# Patient Record
Sex: Male | Born: 1950 | Race: White | Hispanic: No | Marital: Married | Smoking: Never smoker
Health system: Southern US, Community
[De-identification: ages and names within clinical notes are randomized; demographics above are authoritative.]

## PROBLEM LIST (undated history)

## (undated) DIAGNOSIS — M549 Dorsalgia, unspecified: Secondary | ICD-10-CM

## (undated) DIAGNOSIS — G8929 Other chronic pain: Secondary | ICD-10-CM

## (undated) DIAGNOSIS — K219 Gastro-esophageal reflux disease without esophagitis: Secondary | ICD-10-CM

## (undated) DIAGNOSIS — E785 Hyperlipidemia, unspecified: Secondary | ICD-10-CM

## (undated) DIAGNOSIS — G629 Polyneuropathy, unspecified: Secondary | ICD-10-CM

## (undated) HISTORY — DX: Polyneuropathy, unspecified: G62.9

## (undated) HISTORY — PX: OTHER SURGICAL HISTORY: SHX169

## (undated) HISTORY — PX: CARPAL TUNNEL RELEASE: SHX101

## (undated) HISTORY — DX: Hyperlipidemia, unspecified: E78.5

---

## 1998-01-31 ENCOUNTER — Ambulatory Visit (HOSPITAL_COMMUNITY): Admission: RE | Admit: 1998-01-31 | Discharge: 1998-01-31 | Payer: Self-pay | Admitting: Internal Medicine

## 2001-02-03 ENCOUNTER — Ambulatory Visit (HOSPITAL_COMMUNITY): Admission: RE | Admit: 2001-02-03 | Discharge: 2001-02-03 | Payer: Self-pay | Admitting: Gastroenterology

## 2003-02-16 ENCOUNTER — Encounter: Admission: RE | Admit: 2003-02-16 | Discharge: 2003-02-16 | Payer: Self-pay | Admitting: Internal Medicine

## 2003-02-16 ENCOUNTER — Encounter: Payer: Self-pay | Admitting: Internal Medicine

## 2011-07-10 ENCOUNTER — Other Ambulatory Visit: Payer: Self-pay | Admitting: Internal Medicine

## 2011-07-10 DIAGNOSIS — M545 Low back pain, unspecified: Secondary | ICD-10-CM

## 2011-07-11 ENCOUNTER — Ambulatory Visit
Admission: RE | Admit: 2011-07-11 | Discharge: 2011-07-11 | Disposition: A | Discharge status: Home / Self Care | Payer: 59 | Source: Ambulatory Visit | Attending: Internal Medicine | Admitting: Internal Medicine

## 2011-07-11 ENCOUNTER — Other Ambulatory Visit: Payer: Self-pay | Admitting: Internal Medicine

## 2011-07-11 DIAGNOSIS — IMO0001 Reserved for inherently not codable concepts without codable children: Secondary | ICD-10-CM

## 2011-07-11 DIAGNOSIS — M545 Low back pain, unspecified: Secondary | ICD-10-CM

## 2011-07-11 DIAGNOSIS — Z77018 Contact with and (suspected) exposure to other hazardous metals: Secondary | ICD-10-CM

## 2011-07-12 ENCOUNTER — Other Ambulatory Visit: Payer: Self-pay

## 2011-08-25 ENCOUNTER — Encounter (HOSPITAL_COMMUNITY): Payer: Self-pay | Admitting: Emergency Medicine

## 2011-08-25 ENCOUNTER — Emergency Department (HOSPITAL_COMMUNITY)
Admission: EM | Admit: 2011-08-25 | Discharge: 2011-08-25 | Disposition: A | Discharge status: Home / Self Care | Payer: 59 | Attending: Emergency Medicine | Admitting: Emergency Medicine

## 2011-08-25 DIAGNOSIS — M549 Dorsalgia, unspecified: Secondary | ICD-10-CM

## 2011-08-25 DIAGNOSIS — M5432 Sciatica, left side: Secondary | ICD-10-CM

## 2011-08-25 DIAGNOSIS — G8929 Other chronic pain: Secondary | ICD-10-CM | POA: Insufficient documentation

## 2011-08-25 DIAGNOSIS — M5430 Sciatica, unspecified side: Secondary | ICD-10-CM | POA: Insufficient documentation

## 2011-08-25 DIAGNOSIS — J45909 Unspecified asthma, uncomplicated: Secondary | ICD-10-CM | POA: Insufficient documentation

## 2011-08-25 HISTORY — DX: Other chronic pain: G89.29

## 2011-08-25 HISTORY — DX: Dorsalgia, unspecified: M54.9

## 2011-08-25 MED ORDER — HYDROMORPHONE HCL PF 2 MG/ML IJ SOLN
2.0000 mg | Freq: Once | INTRAMUSCULAR | Status: AC
  Administered 2011-08-25: 2 mg via INTRAMUSCULAR
  Filled 2011-08-25: qty 1

## 2011-08-25 MED ORDER — ONDANSETRON 4 MG PO TBDP
8.0000 mg | ORAL_TABLET | Freq: Once | ORAL | Status: AC
  Administered 2011-08-25: 8 mg via ORAL
  Filled 2011-08-25: qty 2

## 2011-08-25 MED ORDER — HYDROMORPHONE HCL 2 MG PO TABS: 2.0000 mg | ORAL_TABLET | ORAL | Status: DC | PRN

## 2011-08-25 MED ORDER — ONDANSETRON HCL 4 MG PO TABS: 4.0000 mg | ORAL_TABLET | Freq: Four times a day (QID) | ORAL | Status: DC

## 2011-08-25 NOTE — Discharge Instructions (Signed)
Charles Harrington keep your follow up appointment with Dr. Jeral Fruit tomorrow and your epidural injection if he says its ok.  Turn to the ER for any bowel or bladder dysfunction, fever,  perineal numbness or weakness in her lower extremity. They did a lot of as needed for pain every 4 hours. Zofran as for nausea.    Back Pain, Adult Back pain is very common. The pain often gets better over time. The cause of back pain is usually not dangerous. Most people can learn to manage their back pain on their own.  HOME CARE   Stay active. Start with short walks on flat ground if you can. Try to walk farther each day.   Do not sit, drive, or stand in one place for more than 30 minutes. Do not stay in bed.   Do not avoid exercise or work. Activity can help your back heal faster.   Be careful when you bend or lift an object. Bend at your knees, keep the object close to you, and do not twist.   Sleep on a firm mattress. Lie on your side, and bend your knees. If you lie on your back, put a pillow under your knees.   Only take medicines as told by your doctor.   Put ice on the injured area.   Put ice in a plastic bag.   Place a towel between your skin and the bag.   Leave the ice on for 15 to 20 minutes, 3 to 4 times a day for the first 2 to 3 days. After that, you can switch between ice and heat packs.   Ask your doctor about back exercises or massage.   Avoid feeling anxious or stressed. Find good ways to deal with stress, such as exercise.  GET HELP RIGHT AWAY IF:   Your pain does not go away with rest or medicine.   Your pain does not go away in 1 week.   You have new problems.   You do not feel well.   The pain spreads into your legs.   You cannot control when you poop (bowel movement) or pee (urinate).   Your arms or legs feel weak or lose feeling (numbness).   You feel sick to your stomach (nauseous) or throw up (vomit).   You have belly (abdominal) pain.   You feel like you may pass  out (faint).  MAKE SURE YOU:   Understand these instructions.   Will watch your condition.   Will get help right away if you are not doing well or get worse.  Document Released: 11/05/2007 Document Revised: 05/08/2011 Document Reviewed: 10/07/2010 [facility] Patient Information 2012 [place], [place].Back Pain, Adult Back pain is very common. The pain often gets better over time. The cause of back pain is usually not dangerous. Most people can learn to manage their back pain on their own.  HOME CARE   Stay active. Start with short walks on flat ground if you can. Try to walk farther each day.   Do not sit, drive, or stand in one place for more than 30 minutes. Do not stay in bed.   Do not avoid exercise or work. Activity can help your back heal faster.   Be careful when you bend or lift an object. Bend at your knees, keep the object close to you, and do not twist.   Sleep on a firm mattress. Lie on your side, and bend your knees. If you lie on your back, put a  pillow under your knees.   Only take medicines as told by your doctor.   Put ice on the injured area.   Put ice in a plastic bag.   Place a towel between your skin and the bag.   Leave the ice on for 15 to 20 minutes, 3 to 4 times a day for the first 2 to 3 days. After that, you can switch between ice and heat packs.   Ask your doctor about back exercises or massage.   Avoid feeling anxious or stressed. Find good ways to deal with stress, such as exercise.  GET HELP RIGHT AWAY IF:   Your pain does not go away with rest or medicine.   Your pain does not go away in 1 week.   You have new problems.   You do not feel well.   The pain spreads into your legs.   You cannot control when you poop (bowel movement) or pee (urinate).   Your arms or legs feel weak or lose feeling (numbness).   You feel sick to your stomach (nauseous) or throw up (vomit).   You have belly (abdominal) pain.   You feel like you may pass out  (faint).  MAKE SURE YOU:   Understand these instructions.   Will watch your condition.   Will get help right away if you are not doing well or get worse.  Document Released: 11/05/2007 Document Revised: 05/08/2011 Document Reviewed: 10/07/2010 [facility] Patient Information 2012 [place], [place].

## 2011-08-25 NOTE — ED Notes (Signed)
Was waiting for MD to put D/C in [facility]

## 2011-08-25 NOTE — ED Provider Notes (Signed)
History     CSN: [id number]  Arrival date & time 08/25/11  1516   First MD Initiated Contact with Patient 08/25/11 1753      Chief Complaint  Patient presents with  . Back Pain    (Consider location/radiation/quality/duration/timing/severity/associated sxs/prior treatment) Patient is a 61 y.o. male presenting with back pain. The history is provided by the patient. No language interpreter was used.  Back Pain  This is a recurrent problem. The current episode started 12 to 24 hours ago. The problem occurs constantly. The problem has not changed since onset.The quality of the pain is described as shooting, aching and burning. The pain radiates to the left knee. The pain is at a severity of 10/10. The pain is severe. The symptoms are aggravated by bending and certain positions. Pertinent negatives include no fever, no numbness, no bowel incontinence, no perianal numbness, no bladder incontinence, no dysuria, no pelvic pain, no leg pain, no paresthesias, no paresis, no tingling and no weakness. Treatments tried: hydrocodone. The treatment provided no relief.   Patient reports left lower back pain that radiates to his buttocks down into his left posterior calf. States that this pain is chronic and he is getting epidural injections for the pain. First injection was 2 weeks ago. He also sees Dr.Botero for this problem. Next appintment with Jeral Fruit is tomorrow am at 11am.  Next epidural injection as this Thursday. Good reflexes to left knee.  Past Medical History  Diagnosis Date  . Asthma   . Chronic back pain     History reviewed. No pertinent past surgical history.  History reviewed. No pertinent family history.  History  Substance Use Topics  . Smoking status: Never Smoker   . Smokeless tobacco: Not on file  . Alcohol Use: No      Review of Systems  Unable to perform ROS Constitutional: Negative.  Negative for fever.  HENT: Negative.   Eyes: Negative.   Respiratory: Negative.     Cardiovascular: Negative.   Gastrointestinal: Negative.  Negative for bowel incontinence.  Genitourinary: Negative for bladder incontinence, dysuria, hematuria, flank pain, testicular pain and pelvic pain.  Musculoskeletal: Positive for back pain and gait problem.  Neurological: Negative.  Negative for dizziness, tingling, weakness, numbness and paresthesias.  Psychiatric/Behavioral: Negative.   All other systems reviewed and are negative.    Allergies  Codeine  Home Medications   Current Outpatient Rx  Name Route Sig Dispense Refill  . ALBUTEROL SULFATE HFA 108 (90 BASE) MCG/ACT IN AERS Inhalation Inhale 2 puffs into the lungs every 4 (four) hours as needed. For shortness of breath    . BUDESONIDE 180 MCG/ACT IN AEPB Inhalation Inhale 2 puffs into the lungs daily.    Marland Kitchen VITAMIN D3 1000 UNITS PO CAPS Oral Take 1 capsule by mouth daily.    Marland Kitchen VITAMIN B-12 PO Oral Take 1 tablet by mouth daily.    . CYCLOBENZAPRINE HCL 10 MG PO TABS Oral Take 10 mg by mouth 3 (three) times daily as needed. For back pain    . DOCUSATE SODIUM 100 MG PO CAPS Oral Take 200 mg by mouth 2 (two) times daily.    Marland Kitchen HYDROCODONE-ACETAMINOPHEN 5-325 MG PO TABS Oral Take 2 tablets by mouth every 6 (six) hours as needed. For pain      BP 142/81  Pulse 93  Temp(Src) 98.7 F (37.1 C) (Oral)  Resp 18  SpO2 94%  Physical Exam  Nursing note and vitals reviewed. Constitutional: He is oriented to person,  place, and time. He appears well-developed and well-nourished.  HENT:  Head: Normocephalic.  Eyes: Conjunctivae and EOM are normal. Pupils are equal, round, and reactive to light.  Neck: Normal range of motion. Neck supple.  Cardiovascular: Normal rate.   Pulmonary/Chest: Effort normal.  Abdominal: Soft.  Musculoskeletal: He exhibits tenderness.       Tenderness to L buttocks down posterior Leg to L calf.  Good reflexes.    Neurological: He is alert and oriented to person, place, and time.  Skin: Skin is warm  and dry.  Psychiatric: He has a normal mood and affect.    ED Course  Procedures (including critical care time)  Labs Reviewed - No data to display No results found.   No diagnosis found.    MDM  Dilaudid 2mg  IM in the ER today for sciatica pain down the L buttocks and LLE.  No numbness, bowel or bladder dysfunction, fever or weakness.  11am appoinment with Dr. Jeral Fruit tomorrow.  Epidural injection on Thursday.  Rx for dilaudid.        Jethro Bastos, NP 08/26/11 1001

## 2011-08-25 NOTE — ED Notes (Signed)
Patient and family verbalized complete understanding of d/c home

## 2011-08-25 NOTE — ED Notes (Signed)
Pt c/o lower back pain with radiation down left leg x several days which became more severe today; pt denies new injury; pt sts hx of bulging

## 2011-08-26 ENCOUNTER — Other Ambulatory Visit: Payer: Self-pay | Admitting: Neurosurgery

## 2011-08-26 ENCOUNTER — Encounter (HOSPITAL_COMMUNITY): Payer: Self-pay | Admitting: Respiratory Therapy

## 2011-08-26 ENCOUNTER — Other Ambulatory Visit (HOSPITAL_COMMUNITY): Payer: Self-pay | Admitting: *Deleted

## 2011-08-26 ENCOUNTER — Encounter (HOSPITAL_COMMUNITY): Payer: Self-pay | Admitting: *Deleted

## 2011-08-26 MED ORDER — CEFAZOLIN SODIUM 1-5 GM-% IV SOLN
1.0000 g | INTRAVENOUS | Status: DC
  Filled 2011-08-26: qty 50

## 2011-08-27 ENCOUNTER — Ambulatory Visit (HOSPITAL_COMMUNITY): Payer: 59

## 2011-08-27 ENCOUNTER — Ambulatory Visit (HOSPITAL_COMMUNITY): Payer: 59 | Admitting: Certified Registered"

## 2011-08-27 ENCOUNTER — Ambulatory Visit (HOSPITAL_COMMUNITY)
Admission: RE | Admit: 2011-08-27 | Discharge: 2011-08-28 | Disposition: A | Discharge status: Home / Self Care | Payer: 59 | Source: Ambulatory Visit | Attending: Neurosurgery | Admitting: Neurosurgery

## 2011-08-27 ENCOUNTER — Encounter (HOSPITAL_COMMUNITY)
Admission: RE | Disposition: A | Discharge status: Home / Self Care | Payer: Self-pay | Source: Ambulatory Visit | Attending: Neurosurgery

## 2011-08-27 ENCOUNTER — Encounter (HOSPITAL_COMMUNITY): Payer: Self-pay | Admitting: Certified Registered"

## 2011-08-27 DIAGNOSIS — J701 Chronic and other pulmonary manifestations due to radiation: Secondary | ICD-10-CM | POA: Insufficient documentation

## 2011-08-27 DIAGNOSIS — J45909 Unspecified asthma, uncomplicated: Secondary | ICD-10-CM | POA: Insufficient documentation

## 2011-08-27 DIAGNOSIS — G8929 Other chronic pain: Secondary | ICD-10-CM | POA: Insufficient documentation

## 2011-08-27 DIAGNOSIS — M5126 Other intervertebral disc displacement, lumbar region: Secondary | ICD-10-CM | POA: Insufficient documentation

## 2011-08-27 HISTORY — PX: LUMBAR LAMINECTOMY/DECOMPRESSION MICRODISCECTOMY: SHX5026

## 2011-08-27 HISTORY — DX: Gastro-esophageal reflux disease without esophagitis: K21.9

## 2011-08-27 LAB — CBC
MCH: 30.4 pg (ref 26.0–34.0)
Platelets: 213 10*3/uL (ref 150–400)
RBC: 4.93 MIL/uL (ref 4.22–5.81)
WBC: 10.1 10*3/uL (ref 4.0–10.5)

## 2011-08-27 LAB — SURGICAL PCR SCREEN: MRSA, PCR: NEGATIVE

## 2011-08-27 LAB — BASIC METABOLIC PANEL
BUN: 13 mg/dL (ref 6–23)
Calcium: 10.1 mg/dL (ref 8.4–10.5)
Creatinine, Ser: 0.91 mg/dL (ref 0.50–1.35)
GFR calc Af Amer: >90 mL/min (ref >90)
GFR calc non Af Amer: >90 mL/min (ref >90)

## 2011-08-27 LAB — GLUCOSE, CAPILLARY: Glucose-Capillary: 81 mg/dL (ref 70–99)

## 2011-08-27 SURGERY — LUMBAR LAMINECTOMY/DECOMPRESSION MICRODISCECTOMY 2 LEVELS
Anesthesia: General | Site: Back | Laterality: Left | Wound class: Clean

## 2011-08-27 MED ORDER — ALBUTEROL SULFATE HFA 108 (90 BASE) MCG/ACT IN AERS
2.0000 | INHALATION_SPRAY | Freq: Four times a day (QID) | RESPIRATORY_TRACT | Status: DC | PRN
Start: 2011-08-27 — End: 2011-08-28
  Filled 2011-08-27: qty 6.7

## 2011-08-27 MED ORDER — DEXAMETHASONE SODIUM PHOSPHATE 4 MG/ML IJ SOLN
INTRAMUSCULAR | Status: DC | PRN
  Administered 2011-08-27: 4 mg via INTRAVENOUS

## 2011-08-27 MED ORDER — MORPHINE SULFATE 4 MG/ML IJ SOLN: 0.0500 mg/kg | INTRAMUSCULAR | Status: DC | PRN

## 2011-08-27 MED ORDER — DIAZEPAM 5 MG PO TABS
5.0000 mg | ORAL_TABLET | Freq: Four times a day (QID) | ORAL | Status: DC | PRN
  Administered 2011-08-27 – 2011-08-28 (×3): 5 mg via ORAL
  Filled 2011-08-27 (×3): qty 1

## 2011-08-27 MED ORDER — FENTANYL CITRATE 0.05 MG/ML IJ SOLN
INTRAMUSCULAR | Status: AC
  Filled 2011-08-27: qty 2

## 2011-08-27 MED ORDER — ACETAMINOPHEN 650 MG RE SUPP: 650.0000 mg | RECTAL | Status: DC | PRN

## 2011-08-27 MED ORDER — FLUTICASONE PROPIONATE HFA 44 MCG/ACT IN AERO
2.0000 | INHALATION_SPRAY | Freq: Two times a day (BID) | RESPIRATORY_TRACT | Status: DC
  Administered 2011-08-28: 2 via RESPIRATORY_TRACT
  Filled 2011-08-27: qty 10.6

## 2011-08-27 MED ORDER — MUPIROCIN 2 % EX OINT
TOPICAL_OINTMENT | CUTANEOUS | Status: AC
  Filled 2011-08-27: qty 22

## 2011-08-27 MED ORDER — CEFAZOLIN SODIUM 1-5 GM-% IV SOLN
1.0000 g | Freq: Three times a day (TID) | INTRAVENOUS | Status: AC
  Administered 2011-08-27 – 2011-08-28 (×2): 1 g via INTRAVENOUS
  Filled 2011-08-27 (×2): qty 50

## 2011-08-27 MED ORDER — MIDAZOLAM HCL 5 MG/5ML IJ SOLN
INTRAMUSCULAR | Status: DC | PRN
  Administered 2011-08-27: 2 mg via INTRAVENOUS

## 2011-08-27 MED ORDER — THROMBIN 5000 UNITS EX SOLR
CUTANEOUS | Status: DC | PRN
  Administered 2011-08-27 (×2): 5000 [IU] via TOPICAL

## 2011-08-27 MED ORDER — HYDROMORPHONE HCL PF 1 MG/ML IJ SOLN
INTRAMUSCULAR | Status: AC
  Administered 2011-08-27: 0.5 mg via INTRAVENOUS
  Filled 2011-08-27: qty 1

## 2011-08-27 MED ORDER — GLYCOPYRROLATE 0.2 MG/ML IJ SOLN
INTRAMUSCULAR | Status: DC | PRN
  Administered 2011-08-27: .6 mg via INTRAVENOUS

## 2011-08-27 MED ORDER — SODIUM CHLORIDE 0.9 % IJ SOLN: 3.0000 mL | INTRAMUSCULAR | Status: DC | PRN

## 2011-08-27 MED ORDER — CEFAZOLIN SODIUM 1-5 GM-% IV SOLN
INTRAVENOUS | Status: DC | PRN
  Administered 2011-08-27: 1 g via INTRAVENOUS

## 2011-08-27 MED ORDER — ACETAMINOPHEN 325 MG PO TABS: 650.0000 mg | ORAL_TABLET | ORAL | Status: DC | PRN

## 2011-08-27 MED ORDER — ONDANSETRON HCL 4 MG/2ML IJ SOLN
INTRAMUSCULAR | Status: DC | PRN
  Administered 2011-08-27: 4 mg via INTRAVENOUS

## 2011-08-27 MED ORDER — LIDOCAINE HCL 4 % MT SOLN
OROMUCOSAL | Status: DC | PRN
  Administered 2011-08-27: 4 mL via TOPICAL

## 2011-08-27 MED ORDER — OXYCODONE-ACETAMINOPHEN 5-325 MG PO TABS
1.0000 | ORAL_TABLET | ORAL | Status: DC | PRN
  Administered 2011-08-28: 2 via ORAL
  Filled 2011-08-27: qty 2

## 2011-08-27 MED ORDER — DOCUSATE SODIUM 100 MG PO CAPS
200.0000 mg | ORAL_CAPSULE | Freq: Two times a day (BID) | ORAL | Status: DC
  Administered 2011-08-27 – 2011-08-28 (×2): 200 mg via ORAL
  Filled 2011-08-27: qty 2
  Filled 2011-08-27: qty 1

## 2011-08-27 MED ORDER — FENTANYL CITRATE 0.05 MG/ML IJ SOLN
INTRAMUSCULAR | Status: DC | PRN
  Administered 2011-08-27 (×3): 50 ug via INTRAVENOUS
  Administered 2011-08-27: 100 ug via INTRAVENOUS

## 2011-08-27 MED ORDER — SODIUM CHLORIDE 0.9 % IV SOLN: 250.0000 mL | INTRAVENOUS | Status: DC

## 2011-08-27 MED ORDER — PHENOL 1.4 % MT LIQD: 1.0000 | OROMUCOSAL | Status: DC | PRN

## 2011-08-27 MED ORDER — LIDOCAINE HCL (CARDIAC) 20 MG/ML IV SOLN
INTRAVENOUS | Status: DC | PRN
  Administered 2011-08-27: 80 mg via INTRAVENOUS

## 2011-08-27 MED ORDER — LACTATED RINGERS IV SOLN: INTRAVENOUS | Status: DC

## 2011-08-27 MED ORDER — LACTATED RINGERS IV SOLN
INTRAVENOUS | Status: DC | PRN
  Administered 2011-08-27 (×2): via INTRAVENOUS

## 2011-08-27 MED ORDER — ONDANSETRON HCL 4 MG PO TABS
4.0000 mg | ORAL_TABLET | Freq: Four times a day (QID) | ORAL | Status: DC
  Administered 2011-08-27 – 2011-08-28 (×3): 4 mg via ORAL
  Filled 2011-08-27 (×6): qty 1

## 2011-08-27 MED ORDER — BUPIVACAINE-EPINEPHRINE PF 0.5-1:200000 % IJ SOLN
INTRAMUSCULAR | Status: DC | PRN
  Administered 2011-08-27: 20 mL

## 2011-08-27 MED ORDER — HYDROMORPHONE HCL PF 1 MG/ML IJ SOLN
INTRAMUSCULAR | Status: AC
  Filled 2011-08-27: qty 1

## 2011-08-27 MED ORDER — ROCURONIUM BROMIDE 100 MG/10ML IV SOLN
INTRAVENOUS | Status: DC | PRN
  Administered 2011-08-27: 50 mg via INTRAVENOUS

## 2011-08-27 MED ORDER — NEOSTIGMINE METHYLSULFATE 1 MG/ML IJ SOLN
INTRAMUSCULAR | Status: DC | PRN
  Administered 2011-08-27: 5 mg via INTRAVENOUS

## 2011-08-27 MED ORDER — PROPOFOL 10 MG/ML IV EMUL
INTRAVENOUS | Status: DC | PRN
  Administered 2011-08-27: 200 mg via INTRAVENOUS

## 2011-08-27 MED ORDER — HYDROMORPHONE HCL PF 1 MG/ML IJ SOLN
0.2500 mg | INTRAMUSCULAR | Status: DC | PRN
  Administered 2011-08-27 (×4): 0.5 mg via INTRAVENOUS

## 2011-08-27 MED ORDER — MORPHINE SULFATE 4 MG/ML IJ SOLN
4.0000 mg | INTRAMUSCULAR | Status: DC | PRN
  Administered 2011-08-27 – 2011-08-28 (×2): 4 mg via INTRAVENOUS
  Filled 2011-08-27 (×2): qty 1

## 2011-08-27 MED ORDER — ONDANSETRON HCL 4 MG/2ML IJ SOLN: 4.0000 mg | INTRAMUSCULAR | Status: DC | PRN

## 2011-08-27 MED ORDER — METHYLPREDNISOLONE ACETATE 80 MG/ML IJ SUSP
INTRAMUSCULAR | Status: DC | PRN
  Administered 2011-08-27: 80 mg

## 2011-08-27 MED ORDER — SODIUM CHLORIDE 0.9 % IJ SOLN
3.0000 mL | Freq: Two times a day (BID) | INTRAMUSCULAR | Status: DC
  Administered 2011-08-27: 3 mL via INTRAVENOUS

## 2011-08-27 MED ORDER — FENTANYL CITRATE 0.05 MG/ML IJ SOLN
INTRAMUSCULAR | Status: DC | PRN
  Administered 2011-08-27: 100 ug via INTRAVENOUS

## 2011-08-27 MED ORDER — MENTHOL 3 MG MT LOZG: 1.0000 | LOZENGE | OROMUCOSAL | Status: DC | PRN

## 2011-08-27 MED ORDER — SODIUM CHLORIDE 0.9 % IV SOLN
INTRAVENOUS | Status: DC
  Administered 2011-08-27: 22:00:00 via INTRAVENOUS

## 2011-08-27 SURGICAL SUPPLY — 55 items
APL SKNCLS STERI-STRIP NONHPOA (GAUZE/BANDAGES/DRESSINGS) ×1
BENZOIN TINCTURE PRP APPL 2/3 (GAUZE/BANDAGES/DRESSINGS) ×2 IMPLANT
BLADE SURG ROTATE 9660 (MISCELLANEOUS) IMPLANT
BUR ACORN 6.0 (BURR) IMPLANT
BUR MATCHSTICK NEURO 3.0 LAGG (BURR) ×2 IMPLANT
CANISTER SUCTION 2500CC (MISCELLANEOUS) ×2 IMPLANT
CLOTH BEACON ORANGE TIMEOUT ST (SAFETY) ×2 IMPLANT
CONT SPEC 4OZ CLIKSEAL STRL BL (MISCELLANEOUS) ×2 IMPLANT
DRAPE LAPAROTOMY 100X72X124 (DRAPES) ×2 IMPLANT
DRAPE MICROSCOPE LEICA (MISCELLANEOUS) ×2 IMPLANT
DRAPE POUCH INSTRU U-SHP 10X18 (DRAPES) ×2 IMPLANT
DRSG PAD ABDOMINAL 8X10 ST (GAUZE/BANDAGES/DRESSINGS) IMPLANT
DURAPREP 26ML APPLICATOR (WOUND CARE) ×2 IMPLANT
ELECT REM PT RETURN 9FT ADLT (ELECTROSURGICAL) ×2
ELECTRODE REM PT RTRN 9FT ADLT (ELECTROSURGICAL) ×1 IMPLANT
GAUZE SPONGE 4X4 16PLY XRAY LF (GAUZE/BANDAGES/DRESSINGS) IMPLANT
GLOVE BIOGEL M 8.0 STRL (GLOVE) ×2 IMPLANT
GLOVE ECLIPSE 7.5 STRL STRAW (GLOVE) ×2 IMPLANT
GLOVE EXAM NITRILE LRG STRL (GLOVE) IMPLANT
GLOVE EXAM NITRILE MD LF STRL (GLOVE) IMPLANT
GLOVE EXAM NITRILE XL STR (GLOVE) IMPLANT
GLOVE EXAM NITRILE XS STR PU (GLOVE) IMPLANT
GLOVE INDICATOR 7.5 STRL GRN (GLOVE) ×2 IMPLANT
GOWN BRE IMP SLV AUR LG STRL (GOWN DISPOSABLE) ×2 IMPLANT
GOWN BRE IMP SLV AUR XL STRL (GOWN DISPOSABLE) ×2 IMPLANT
GOWN STRL REIN 2XL LVL4 (GOWN DISPOSABLE) IMPLANT
KIT BASIN OR (CUSTOM PROCEDURE TRAY) ×2 IMPLANT
KIT ROOM TURNOVER OR (KITS) ×2 IMPLANT
NDL HYPO 18GX1.5 BLUNT FILL (NEEDLE) IMPLANT
NDL HYPO 21X1.5 SAFETY (NEEDLE) IMPLANT
NDL HYPO 25X1 1.5 SAFETY (NEEDLE) IMPLANT
NDL SPNL 20GX3.5 QUINCKE YW (NEEDLE) IMPLANT
NEEDLE HYPO 18GX1.5 BLUNT FILL (NEEDLE) ×2 IMPLANT
NEEDLE HYPO 21X1.5 SAFETY (NEEDLE) IMPLANT
NEEDLE HYPO 25X1 1.5 SAFETY (NEEDLE) IMPLANT
NEEDLE SPNL 20GX3.5 QUINCKE YW (NEEDLE) IMPLANT
NS IRRIG 1000ML POUR BTL (IV SOLUTION) ×2 IMPLANT
PACK LAMINECTOMY NEURO (CUSTOM PROCEDURE TRAY) ×2 IMPLANT
PAD ARMBOARD 7.5X6 YLW CONV (MISCELLANEOUS) ×6 IMPLANT
PATTIES SURGICAL .5 X1 (DISPOSABLE) ×2 IMPLANT
RUBBERBAND STERILE (MISCELLANEOUS) ×4 IMPLANT
SPONGE GAUZE 4X4 12PLY (GAUZE/BANDAGES/DRESSINGS) ×2 IMPLANT
SPONGE LAP 4X18 X RAY DECT (DISPOSABLE) IMPLANT
SPONGE SURGIFOAM ABS GEL SZ50 (HEMOSTASIS) ×2 IMPLANT
STRIP CLOSURE SKIN 1/2X4 (GAUZE/BANDAGES/DRESSINGS) ×2 IMPLANT
SUT VIC AB 0 CT1 18XCR BRD8 (SUTURE) ×1 IMPLANT
SUT VIC AB 0 CT1 8-18 (SUTURE) ×2
SUT VIC AB 2-0 CP2 18 (SUTURE) ×2 IMPLANT
SUT VIC AB 3-0 SH 8-18 (SUTURE) ×2 IMPLANT
SYR 20CC LL (SYRINGE) IMPLANT
SYR 20ML ECCENTRIC (SYRINGE) ×2 IMPLANT
SYR 5ML LL (SYRINGE) ×1 IMPLANT
TOWEL OR 17X24 6PK STRL BLUE (TOWEL DISPOSABLE) ×2 IMPLANT
TOWEL OR 17X26 10 PK STRL BLUE (TOWEL DISPOSABLE) ×2 IMPLANT
WATER STERILE IRR 1000ML POUR (IV SOLUTION) ×2 IMPLANT

## 2011-08-27 NOTE — Preoperative (Signed)
Beta Blockers   Reason not to administer Beta Blockers:Not Applicable 

## 2011-08-27 NOTE — Transfer of Care (Signed)
Immediate Anesthesia Transfer of Care Note  Patient: Charles Harrington  Procedure(s) Performed: Procedure(s) (LRB): LUMBAR LAMINECTOMY/DECOMPRESSION MICRODISCECTOMY 2 LEVELS (Left)  Patient Location: PACU  Anesthesia Type: General  Level of Consciousness: awake and alert   Airway & Oxygen Therapy: Patient Spontanous Breathing and Patient connected to face mask oxygen  Post-op Assessment: Report given to PACU RN and Post -op Vital signs reviewed and stable  Post vital signs: Reviewed and stable  Complications: No apparent anesthesia complications

## 2011-08-27 NOTE — Progress Notes (Signed)
Left l45discectomy and l5s1 foramonotomy done. Op note [phone]

## 2011-08-27 NOTE — Anesthesia Procedure Notes (Signed)
Procedure Name: Intubation Date/Time: 08/27/2011 4:13 PM Performed by: Glendora Score A Pre-anesthesia Checklist: Patient identified, Emergency Drugs available, Suction available and Patient being monitored Patient Re-evaluated:Patient Re-evaluated prior to inductionOxygen Delivery Method: Circle system utilized Preoxygenation: Pre-oxygenation with 100% oxygen Intubation Type: IV induction Ventilation: Mask ventilation without difficulty and Oral airway inserted - appropriate to patient size Laryngoscope Size: Hyacinth Meeker and 2 Grade View: Grade I Tube type: Oral Tube size: 7.5 mm Number of attempts: 1 Airway Equipment and Method: Stylet and LTA kit utilized Placement Confirmation: ETT inserted through vocal cords under direct vision,  positive ETCO2 and breath sounds checked- equal and bilateral Secured at: 23 cm Tube secured with: Tape Dental Injury: Teeth and Oropharynx as per pre-operative assessment

## 2011-08-27 NOTE — H&P (Signed)
Charles Harrington is an 61 y.o. male.   Chief Complaint:left leg pain. HPI: patient seen on 08/03/11 with c/o lbp with radiation to both legs. Seen in the emergency room with pain going to the left leg. Quite miserable . No c/o of gu or gi. mriof lumbar spine showed large hnp at l45 and l5s1 centrally.wants to go ahead with surgery since he is not getting better with conservative treatment.  Past Medical History  Diagnosis Date  . Asthma   . Chronic back pain   . Asthma   . GERD (gastroesophageal reflux disease)     Past Surgical History  Procedure Date  . Colonscopy     Family History  Problem Relation Age of Onset  . Anesthesia problems Neg Hx    Social History:  reports that he has never smoked. He does not have any smokeless tobacco history on file. He reports that he does not drink alcohol or use illicit drugs.  Allergies:  Allergies  Allergen Reactions  . Codeine Nausea And Vomiting    Medications Prior to Admission  Medication Dose Route Frequency Provider Last Rate Last Dose  . ceFAZolin (ANCEF) IVPB 1 g/50 mL premix  1 g Intravenous 60 min Pre-Op Karn Cassis, MD      . HYDROmorphone (DILAUDID) injection 2 mg  2 mg Intramuscular Once Jethro Bastos, NP   2 mg at 08/25/11 1847  . mupirocin ointment (BACTROBAN) 2 %           . ondansetron (ZOFRAN-ODT) disintegrating tablet 8 mg  8 mg Oral Once Jethro Bastos, NP   8 mg at 08/25/11 1849   Medications Prior to Admission  Medication Sig Dispense Refill  . albuterol (PROVENTIL HFA;VENTOLIN HFA) 108 (90 BASE) MCG/ACT inhaler Inhale 2 puffs into the lungs every 4 (four) hours as needed. For shortness of breath      . budesonide (PULMICORT) 180 MCG/ACT inhaler Inhale 2 puffs into the lungs daily.      . Cholecalciferol (VITAMIN D3) 1000 UNITS CAPS Take 1 capsule by mouth daily.      . Cyanocobalamin (VITAMIN B-12 PO) Take 1 tablet by mouth daily.      . cyclobenzaprine (FLEXERIL) 10 MG tablet Take 10 mg by mouth 3 (three)  times daily as needed. For back pain      . docusate sodium (COLACE) 100 MG capsule Take 200 mg by mouth 2 (two) times daily.      Marland Kitchen HYDROcodone-acetaminophen (NORCO) 5-325 MG per tablet Take 2 tablets by mouth every 6 (six) hours as needed. For pain      . HYDROmorphone (DILAUDID) 2 MG tablet Take 1 tablet (2 mg total) by mouth every 4 (four) hours as needed for pain.  12 tablet  0  . ondansetron (ZOFRAN) 4 MG tablet Take 1 tablet (4 mg total) by mouth every 6 (six) hours.  12 tablet  0    Results for orders placed during the hospital encounter of 08/27/11 (from the past 48 hour(s))  CBC     Status: Normal   Collection Time   08/27/11  1:59 PM      Component Value Range Comment   WBC 10.1  4.0 - 10.5 (K/uL)    RBC 4.93  4.22 - 5.81 (MIL/uL)    Hemoglobin 15.0  13.0 - 17.0 (g/dL)    HCT 16.1  09.6 - 04.5 (%)    MCV 92.7  78.0 - 100.0 (fL)    MCH 30.4  26.0 - 34.0 (pg)    MCHC 32.8  30.0 - 36.0 (g/dL)    RDW 96.0  45.4 - 09.8 (%)    Platelets 213  150 - 400 (K/uL)   GLUCOSE, CAPILLARY     Status: Normal   Collection Time   08/27/11  2:09 PM      Component Value Range Comment   Glucose-Capillary 81  70 - 99 (mg/dL)    No results found.  Review of Systems  Constitutional: Negative.   HENT: Negative.   Eyes: Negative.   Respiratory:       ASTHMA  Cardiovascular: Negative.        ARTERIAL HYPERTENSION  Gastrointestinal: Negative.   Genitourinary: Negative.   Musculoskeletal: Positive for back pain.  Skin: Negative.   Neurological: Positive for focal weakness.  Endo/Heme/Allergies:       DM  Psychiatric/Behavioral: Negative.     Blood pressure 129/85, pulse 85, temperature 98.6 F (37 C), temperature source Oral, resp. rate 18, height 6\' 1"  (1.854 m), weight 97.523 kg (215 lb), SpO2 97.00%. Physical Exam  patient came to my office with his wife, limping with the left leg.hent, nl. Neck,nl. Lungs some mild wheezing bilaterally. Abdomen, nl. Extremities, nl. NEURO weakness of  df left foot. slr positive at 45 degrees.   Assessment/Plan Left l45 and l5s1 laminotomies with possible discectomies and foraminotomies. Since he is not having pain in the right leg we are going only with the symptomatic side. Patient and wife are aware of risks and benefits.  Shoichi Mielke M 08/27/2011, 3:11 PM

## 2011-08-27 NOTE — Anesthesia Postprocedure Evaluation (Signed)
Anesthesia Post Note  Patient: Charles Harrington  Procedure(s) Performed: Procedure(s) (LRB): LUMBAR LAMINECTOMY/DECOMPRESSION MICRODISCECTOMY 2 LEVELS (Left)  Anesthesia type: general  Patient location: PACU  Post pain: Pain level controlled  Post assessment: Patient's Cardiovascular Status Stable  Last Vitals:  Filed Vitals:   08/27/11 1900  BP: 161/78  Pulse: 94  Temp:   Resp: 16    Post vital signs: Reviewed and stable  Level of consciousness: sedated  Complications: No apparent anesthesia complications

## 2011-08-27 NOTE — Anesthesia Preprocedure Evaluation (Addendum)
Anesthesia Evaluation  Patient identified by MRN, date of birth, ID band Patient awake    Reviewed: Allergy & Precautions, H&P , NPO status , Patient's Chart, lab work & pertinent test results  Airway Mallampati: I TM Distance: >3 FB Neck ROM: Full    Dental  (+) Teeth Intact   Pulmonary asthma ,  breath sounds clear to auscultation        Cardiovascular negative cardio ROS  Rhythm:Regular Rate:Normal     Neuro/Psych negative neurological ROS     GI/Hepatic Neg liver ROS, GERD-  ,  Endo/Other  negative endocrine ROS  Renal/GU negative Renal ROS     Musculoskeletal   Abdominal   Peds  Hematology negative hematology ROS (+)   Anesthesia Other Findings   Reproductive/Obstetrics                         Anesthesia Physical Anesthesia Plan  ASA: II  Anesthesia Plan: General   Post-op Pain Management:    Induction: Intravenous  Airway Management Planned: Oral ETT  Additional Equipment:   Intra-op Plan:   Post-operative Plan: Extubation in OR  Informed Consent: I have reviewed the patients History and Physical, chart, labs and discussed the procedure including the risks, benefits and alternatives for the proposed anesthesia with the patient or authorized representative who has indicated his/her understanding and acceptance.   Dental advisory given  Plan Discussed with: CRNA, Anesthesiologist and Surgeon  Anesthesia Plan Comments:         Anesthesia Quick Evaluation

## 2011-08-28 ENCOUNTER — Encounter (HOSPITAL_COMMUNITY): Payer: Self-pay | Admitting: Neurosurgery

## 2011-08-28 NOTE — Progress Notes (Signed)
Utilization review completed. Belford Pascucci, RN, BSN. 08/28/11  

## 2011-08-28 NOTE — Plan of Care (Signed)
Problem: Consults Goal: Diagnosis - Spinal Surgery Outcome: Completed/Met Date Met:  08/28/11 Microdiscectomy

## 2011-08-28 NOTE — Progress Notes (Signed)
CARE MANAGEMENT NOTE 08/28/2011  Patient:  Charles Harrington, Charles Harrington   Account Number:  [id number]  Date Initiated:  08/28/2011  Documentation initiated by:  Vance Peper  Subjective/Objective Assessment:   61 yr old male s/p L5 hemilaminectomy and removal of L4 fragment, foraminotomy at L5-S1 left     Action/Plan:   No Home Health needs identified.   Anticipated DC Date:  08/28/2011   Anticipated DC Plan:  HOME/SELF CARE      DC Planning Services  CM consult      PAC Choice  NA   Choice offered to / List presented to:             Status of service:  Completed, signed off

## 2011-08-28 NOTE — Discharge Summary (Signed)
Physician Discharge Summary  Patient ID: Charles Harrington MRN: [id number] DOB/AGE: 01-26-1951 61 y.o.  Admit date: 08/27/2011 Discharge date: 08/28/2011  Admission Diagnoses:left l45 and l5s1 hnp  Discharge Diagnoses: same   Discharged Condition: incisional pain  Hospital Course: surgery  Consults: none  Significant Diagnostic Studies: mri  Treatments: surgery  Discharge Exam: Blood pressure 102/43, pulse 76, temperature 98.8 F (37.1 C), temperature source Oral, resp. rate 18, height 6\' 1"  (1.854 m), weight 97.523 kg (215 lb), SpO2 97.00%. Incisional pain. No weakness  Disposition: home. To see me in 3 weeks   Medication List  As of 08/28/2011  9:32 AM   ASK your doctor about these medications         albuterol 108 (90 BASE) MCG/ACT inhaler   Commonly known as: PROVENTIL HFA;VENTOLIN HFA   Inhale 2 puffs into the lungs every 4 (four) hours as needed. For shortness of breath      budesonide 180 MCG/ACT inhaler   Commonly known as: PULMICORT   Inhale 2 puffs into the lungs daily.      cyclobenzaprine 10 MG tablet   Commonly known as: FLEXERIL   Take 10 mg by mouth 3 (three) times daily as needed. For back pain      docusate sodium 100 MG capsule   Commonly known as: COLACE   Take 200 mg by mouth 2 (two) times daily.      HYDROcodone-acetaminophen 5-325 MG per tablet   Commonly known as: NORCO   Take 2 tablets by mouth every 6 (six) hours as needed. For pain      HYDROmorphone 2 MG tablet   Commonly known as: DILAUDID   Take 1 tablet (2 mg total) by mouth every 4 (four) hours as needed for pain.      ondansetron 4 MG tablet   Commonly known as: ZOFRAN   Take 1 tablet (4 mg total) by mouth every 6 (six) hours.      VITAMIN B-12 PO   Take 1 tablet by mouth daily.      Vitamin D3 1000 UNITS Caps   Take 1 capsule by mouth daily.             Signed: Karn Cassis 08/28/2011, 9:32 AM

## 2011-08-28 NOTE — Op Note (Signed)
NAMEMARGUIS, [name] NO.:  [id number]  MEDICAL RECORD NO.:  [id number]  LOCATION:  3022                         FACILITY:  [facility]  PHYSICIAN:  Hilda Lias, M.D.   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  POSTOPERATIVE DIAGNOSIS:  Left L4-L5 herniated disk with a large fragment, 3 of them.  Stenosis, L5-S1.  PROCEDURE:  Left L5 hemilaminectomy and removal of L4 fragment at the level of L4-L5 with decompression of the thecal sac.  Foraminotomy at L5- S1, left.  Microscope.  SURGEON:  Hilda Lias, MD  ASSISTANT:  Tia Alert, MD  CLINICAL HISTORY:  The patient was seen in my office 3 days ago who comes with sudden onset of back pain with radiation to the left leg. The patient in the past had some problems but by the time he came to my office 3 days ago, he was in a wheelchair.  He was unable to lift the left leg and he got limping from the left leg.  He got [place]. Because of the findings in the MRI, surgery was advised.  DESCRIPTION OF PROCEDURE:  The patient was taken to the OR, and after intubation, he was positioned in a prone manner.  The skin was cleaned with DuraPrep.  Drapes were applied.  Midline incision from L4-L5 down to L5-S1 was made and muscles were retracted for lateral view.  X-ray showed that we were at the L4-L5 and L5-S1.  From then on, we found that the canal was quite vertical with lateral view of thickened calcification of the yellow ligament.  We did hemilaminotomy at L5-S1 and we found mostly on the way up.  The area between L4-L5 was tight and because of that we decided to proceed with hemilaminectomy at L5.  With the help of the laparoscope, we found out at the level of L4-L5 that there was large herniated disk with  displacement of the thecal sac and stenosis of the L5 nerve root.  Micro dissection was performed.  After irrigation, we were able to retract slightly the L5 nerve root and we removed 3 large free fragments of disk.  There was enough disk space and we found mostly degenerative disk disease.  There were no areas of nerve traction.  The L5 nerve root came back to normal size. Then, we went back to L5-S1 and we did a foraminotomy to decompress the S1 nerve root.  The area was irrigated.  Valsalva maneuver was negative. Fentanyl and Depo-Medrol were left in the epidural space and the wound was closed using Vicryl and Steri-Strips.          ______________________________ Hilda Lias, M.D.     EB/MEDQ  D:  08/27/2011  T:  08/28/2011  Job:  [id number]

## 2011-08-28 NOTE — Progress Notes (Signed)
Physical Therapy Evaluation Patient Details Name: Charles Harrington MRN: [id number] DOB: December 26, 1950 Today's Date: 08/28/2011  Problem List:  Patient Active Problem List  Diagnoses  . Chronic back pain    Past Medical History:  Past Medical History  Diagnosis Date  . Asthma   . Chronic back pain   . Asthma   . GERD (gastroesophageal reflux disease)    Past Surgical History:  Past Surgical History  Procedure Date  . Colonscopy     PT Assessment/Plan/Recommendation PT Assessment Clinical Impression Statement: Pt presents with a medical diagnosis ofL45 discectomy and L5-S1 foraminotomy. Pt is at his baseline functional level requiring supervision due to pain. Pt does not have any acute PT needs.  PT Recommendation/Assessment: Patent does not need any further PT services No Skilled PT: All education completed;Patient at baseline level of functioning;Patient will have necessary level of assist by caregiver at discharge PT Recommendation Follow Up Recommendations: No PT follow up;Supervision for mobility/OOB Equipment Recommended: None recommended by OT;None recommended by PT PT Goals     PT Evaluation Precautions/Restrictions  Precautions Precautions: Back Precaution Booklet Issued: Yes (comment) Precaution Comments: Pt given handout.  Educated on maintaining back precautions during functional tasks. Required Braces or Orthoses: No Restrictions Weight Bearing Restrictions: No Prior Functioning  Home Living Lives With: Spouse Receives Help From: Family Type of Home: House Home Layout: One level Home Access: Stairs to enter Entrance Stairs-Rails: Left Entrance Stairs-Number of Steps: 1 Bathroom Shower/Tub: Psychologist, counselling (built in seat) Firefighter: Handicapped height Bathroom Accessibility: Yes How Accessible: Accessible via walker Home Adaptive Equipment:  (can borrow RW) Prior Function Level of Independence: Independent with basic ADLs;Independent with homemaking  with ambulation;Independent with gait Able to Take Stairs?: Yes Driving: Yes Vocation: Full time employment 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 Arousal/Alertness: Awake/alert Overall Cognitive Status: Appears within functional limits for tasks assessed Orientation Level: Oriented X4 Sensation/Coordination Sensation Light Touch: Appears Intact Extremity Assessment RUE Assessment RUE Assessment: Within Functional Limits LUE Assessment LUE Assessment: Within Functional Limits RLE Assessment RLE Assessment: Within Functional Limits LLE Assessment LLE Assessment: Within Functional Limits Mobility (including Balance) Bed Mobility Bed Mobility: No (up in chair; educated pt on technique) Transfers Transfers: Yes Sit to Stand: 5: Supervision;From chair/3-in-1;With armrests;With upper extremity assist Sit to Stand Details (indicate cue type and reason): supervision for safety. cueing for safe hand placement and technique. Stand to Sit: 5: Supervision;To chair/3-in-1;With armrests;With upper extremity assist Stand to Sit Details: supervision for safety. cueing for safe technique and hand placement. Ambulation/Gait Ambulation/Gait: Yes Ambulation/Gait Assistance: 5: Supervision;4: Min assist Ambulation/Gait Assistance Details (indicate cue type and reason): Min assist for handheld assist at the beginning of ambulation,supervision at the end for safety. Pt with no functional deficits, slightly antalgic during movements Ambulation Distance (Feet): 200 Feet Assistive device: 1 person hand held assist;None Gait Pattern: Within Functional Limits;Antalgic Gait velocity: slightly decreased gait speed, improved with distance Stairs: Yes Stairs Assistance: 5: Supervision Stairs Assistance Details (indicate cue type and reason): VC for proper sequencing Stair Management Technique: One rail Left;Alternating pattern;Forwards Number of Stairs: 3     Exercise      End of Session PT - End of Session Equipment Utilized During Treatment: Gait belt Activity Tolerance: Patient tolerated treatment well Patient left: in chair;with call bell in reach;with family/visitor present Nurse Communication: Mobility status for transfers;Mobility status for ambulation General Behavior During Session: [facility] for tasks performed Cognition: [facility] for tasks performed  Milana Kidney 08/28/2011, 11:45 AM  08/28/2011  Milana Kidney DPT PAGER: [phone] OFFICE: [phone]

## 2011-08-28 NOTE — Progress Notes (Signed)
Occupational Therapy Evaluation Patient Details Name: Charles Harrington MRN: [id number] DOB: 09-Nov-1950 Today's Date: 08/28/2011  Problem List:  Patient Active Problem List  Diagnoses  . Chronic back pain    Past Medical History:  Past Medical History  Diagnosis Date  . Asthma   . Chronic back pain   . Asthma   . GERD (gastroesophageal reflux disease)    Past Surgical History:  Past Surgical History  Procedure Date  . Colonscopy     OT Assessment/Plan/Recommendation OT Assessment Clinical Impression Statement: Pt s/p L5 hemilaminectomy and removal of L4 fragment, foraminotomy at L5-S1 left. All education completed. Pt will have necessary level of assistance upon d/c home with wife. No further acute OT needs.  OT Recommendation/Assessment: Patient does not need any further OT services OT Recommendation Follow Up Recommendations: Supervision - Intermittent Equipment Recommended: None recommended by OT OT Goals    OT Evaluation Precautions/Restrictions  Precautions Precautions: Back Precaution Booklet Issued: Yes (comment) Precaution Comments: Pt given handout.  Educated on maintaining back precautions during functional tasks. Restrictions Weight Bearing Restrictions: No Prior Functioning Home Living Lives With: Spouse Type of Home: House Home Layout: One level Home Access: Stairs to enter Entrance Stairs-Rails: Left Entrance Stairs-Number of Steps: 1 Bathroom Shower/Tub: Psychologist, counselling (built in seat) Firefighter: Handicapped height Bathroom Accessibility: Yes How Accessible: Accessible via walker Home Adaptive Equipment:  (can borrow RW) Prior Function Level of Independence: Independent with basic ADLs;Independent with homemaking with ambulation;Independent with gait Driving: Yes Vocation: Full time employment 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 feet by crossing ankles over knees. Where Assessed - Lower Body Bathing: Sit to stand from chair Lower Body Dressing: Simulated;Supervision/safety Lower Body Dressing Details (indicate cue type and reason): Pt able to don/doff bil. socks by crossing ankles over knees. Where Assessed - Lower Body Dressing: Sit to stand from chair Toilet Transfer: Simulated;Supervision/safety Toilet Transfer Details (indicate cue type and reason): supervision for safety. cueing for hand placement.  Toilet Transfer Method: Proofreader: Other (comment) (chair) Tub/Shower Transfer: Landscape architect Details (indicate cue type and reason): Simulated walk in shower. Supervision for safety, Tub/Shower Transfer Method: Ambulating Ambulation Related to ADLs: Pt ambulated around unit with supervision for safety and increased time.   ADL Comments: Pt educated on safe ADL techniques while maintaining back precautions. Vision/Perception    Cognition Cognition Arousal/Alertness: Awake/alert Overall Cognitive Status: Appears within functional limits for tasks assessed Orientation Level: Oriented X4 Sensation/Coordination   Extremity Assessment RUE Assessment RUE Assessment: Within Functional Limits LUE Assessment LUE Assessment: Within Functional Limits Mobility  Bed Mobility Bed Mobility: No (up in chair; educated pt on technique) Transfers Transfers: Yes Sit to Stand: 5: Supervision;From chair/3-in-1;With armrests;With upper extremity assist Sit to Stand Details (indicate cue type and reason): supervision for safety. cueing for safe hand placement and technique. Stand to Sit: 5: Supervision;To chair/3-in-1;With armrests;With upper extremity assist Stand to Sit Details: supervision for safety. cueing for safe technique and hand placement. Exercises   End of Session OT - End of Session Activity Tolerance:  Patient tolerated treatment well Patient left: in chair;with call bell in reach;with family/visitor present Nurse Communication: Mobility status for transfers;Mobility status for ambulation General Behavior During Session: [facility] for tasks performed Cognition: [facility] for tasks performed  11:38 AM  08/28/2011 Cipriano Mile OTR/L Pager [phone] Office [phone]

## 2011-09-05 NOTE — ED Provider Notes (Signed)
Medical screening examination/treatment/procedure(s) were performed by non-physician practitioner and as supervising physician I was immediately available for consultation/collaboration.  Raeford Razor, MD 09/05/11 [phone]

## 2013-01-24 LAB — PSA: PSA: 1.58

## 2014-03-03 LAB — HEPATIC FUNCTION PANEL
ALT: 33 U/L (ref 10–40)
AST: 23 U/L (ref 14–40)
Alkaline Phosphatase: 80 U/L (ref 25–125)
Bilirubin, Total: 0.4 mg/dL

## 2014-03-03 LAB — LIPID PANEL
Cholesterol: 262 mg/dL — AB (ref 0–200)
HDL: 36 mg/dL (ref 35–70)
LDL Cholesterol: 153 mg/dL
TRIGLYCERIDES: 366 mg/dL — AB (ref 40–160)

## 2014-03-03 LAB — HEMOGLOBIN A1C: Hgb A1c MFr Bld: 5.8 % (ref 4.0–6.0)

## 2014-03-03 LAB — TSH: TSH: 1.41 u[IU]/mL (ref <=5.90)

## 2014-03-03 LAB — BASIC METABOLIC PANEL
BUN: 11 mg/dL (ref 4–21)
CREATININE: 0.9 mg/dL (ref <=1.3)
GLUCOSE: 82 mg/dL
POTASSIUM: 4.5 mmol/L (ref 3.4–5.3)
Sodium: 136 mmol/L — AB (ref 137–147)

## 2014-03-06 LAB — CBC AND DIFFERENTIAL
HEMATOCRIT: 44 % (ref 41–53)
HEMOGLOBIN: 14.4 g/dL (ref 13.5–17.5)
PLATELETS: 217 10*3/uL (ref 150–399)
WBC: 9.6 10^3/mL

## 2014-11-10 ENCOUNTER — Encounter: Payer: Self-pay | Admitting: Family Medicine

## 2014-11-10 LAB — VITAMIN B12: VITAMIN B12 BIND CAP: 716

## 2014-11-10 LAB — PSA: PSA, FREE: 1.61

## 2015-02-14 ENCOUNTER — Ambulatory Visit (INDEPENDENT_AMBULATORY_CARE_PROVIDER_SITE_OTHER): Payer: 59 | Admitting: Family Medicine

## 2015-02-14 ENCOUNTER — Encounter: Payer: Self-pay | Admitting: Family Medicine

## 2015-02-14 VITALS — BP 130/68 | HR 69 | Temp 98.6°F | Ht 72.0 in | Wt 229.0 lb

## 2015-02-14 DIAGNOSIS — G5602 Carpal tunnel syndrome, left upper limb: Secondary | ICD-10-CM

## 2015-02-14 DIAGNOSIS — Z23 Encounter for immunization: Secondary | ICD-10-CM | POA: Diagnosis not present

## 2015-02-14 DIAGNOSIS — J4520 Mild intermittent asthma, uncomplicated: Secondary | ICD-10-CM | POA: Diagnosis not present

## 2015-02-14 DIAGNOSIS — G5601 Carpal tunnel syndrome, right upper limb: Secondary | ICD-10-CM | POA: Diagnosis not present

## 2015-02-14 DIAGNOSIS — E785 Hyperlipidemia, unspecified: Secondary | ICD-10-CM

## 2015-02-14 DIAGNOSIS — G5603 Carpal tunnel syndrome, bilateral upper limbs: Secondary | ICD-10-CM

## 2015-02-14 DIAGNOSIS — H9190 Unspecified hearing loss, unspecified ear: Secondary | ICD-10-CM | POA: Insufficient documentation

## 2015-02-14 DIAGNOSIS — R739 Hyperglycemia, unspecified: Secondary | ICD-10-CM | POA: Insufficient documentation

## 2015-02-14 DIAGNOSIS — J45909 Unspecified asthma, uncomplicated: Secondary | ICD-10-CM | POA: Insufficient documentation

## 2015-02-14 DIAGNOSIS — K219 Gastro-esophageal reflux disease without esophagitis: Secondary | ICD-10-CM | POA: Insufficient documentation

## 2015-02-14 DIAGNOSIS — G5600 Carpal tunnel syndrome, unspecified upper limb: Secondary | ICD-10-CM | POA: Insufficient documentation

## 2015-02-14 DIAGNOSIS — G609 Hereditary and idiopathic neuropathy, unspecified: Secondary | ICD-10-CM | POA: Insufficient documentation

## 2015-02-14 MED ORDER — GABAPENTIN 300 MG PO CAPS: 300.0000 mg | ORAL_CAPSULE | Freq: Two times a day (BID) | ORAL | Status: DC

## 2015-02-14 MED ORDER — ALBUTEROL SULFATE HFA 108 (90 BASE) MCG/ACT IN AERS: 2.0000 | INHALATION_SPRAY | Freq: Four times a day (QID) | RESPIRATORY_TRACT | Status: DC | PRN

## 2015-02-14 MED ORDER — DULOXETINE HCL 60 MG PO CPEP: 60.0000 mg | ORAL_CAPSULE | Freq: Every day | ORAL | Status: DC

## 2015-02-14 NOTE — Assessment & Plan Note (Signed)
S:albuterol once every other week, likely cat allergy- as used to be multiple times a week before his cat died A/P: Continue when necessary albuterol. Well-controlled

## 2015-02-14 NOTE — Progress Notes (Signed)
Charles Reddish, MD Phone: [phone]  Subjective:  Patient presents today to establish care. Patient was formerly a patient of Dr. Lavone Orn, MD of [facility] physicians. Chief complaint-noted.   See problem oriented charting -Tetanus shot about 10 years ago PNA shot 3 years ago due to history of lung issues/asthma Colonoscopy around age 64. Notes 2014 state overdue-ordered but does not appear done Active job- walk and lift ROS- no extremity weakness, no  chest pain. Intermittent shortness of breath control with albuterol. Intermittent reflux controlled when he takes his omeprazole  The following were reviewed and entered/updated in epic: Past Medical History  Diagnosis Date  . Asthma     albuterol once every other week, likely cat allergy  . Chronic back pain     mild after back surgery L4 and L5 ruptured disc with surgery 2013  . GERD (gastroesophageal reflux disease)     omeprazole prn  . Hyperlipidemia     no rx  . Neuropathy     gabapentin 300mg  BID   Patient Active Problem List   Diagnosis Date Noted  . Carpal tunnel syndrome 02/14/2015    Priority: Medium  . Hereditary and idiopathic peripheral neuropathy 02/14/2015    Priority: Medium  . Hyperglycemia 02/14/2015    Priority: Medium  . Asthma     Priority: Medium  . Hyperlipidemia     Priority: Medium  . Hearing loss 02/14/2015    Priority: Low  . GERD (gastroesophageal reflux disease)     Priority: Low  . Chronic back pain     Priority: Low   Past Surgical History  Procedure Laterality Date  . Colonscopy    . Lumbar laminectomy/decompression microdiscectomy  08/27/2011    Procedure: LUMBAR LAMINECTOMY/DECOMPRESSION MICRODISCECTOMY 2 LEVELS;  Surgeon: Floyce Stakes, MD;  Location: [place] [facility];  Service: Neurosurgery;  Laterality: Left;  Left Lumbar four- five, five-sacral one Diskectomy    Family History  Problem Relation Age of Onset  . Anesthesia problems Neg Hx   . Hypercholesterolemia Mother     father, sisters, brothers  . Heart disease Sister     CABG sister 76- heavy smoker   . Stroke Mother     late 75s, early 42s  . Diabetes Mother     Medications- reviewed and updated Current Outpatient Prescriptions  Medication Sig Dispense Refill  . docusate sodium (COLACE) 100 MG capsule Take 200 mg by mouth 2 (two) times daily.    Marland Kitchen albuterol (PROVENTIL HFA;VENTOLIN HFA) 108 (90 BASE) MCG/ACT inhaler Inhale 2 puffs into the lungs every 6 (six) hours as needed. For shortness of breath 6.7 g 11  . DULoxetine (CYMBALTA) 60 MG capsule Take 1 capsule (60 mg total) by mouth daily. 30 capsule 11  . gabapentin (NEURONTIN) 300 MG capsule Take 1 capsule (300 mg total) by mouth 2 (two) times daily. 60 capsule 11   No current facility-administered medications for this visit.    Allergies-reviewed and updated Allergies  Allergen Reactions  . Codeine Nausea And Vomiting    Social History   Social History  . Marital Status: Married    Spouse Name: N/A  . Number of Children: N/A  . Years of Education: N/A   Social History Main Topics  . Smoking status: Never Smoker   . Smokeless tobacco: None  . Alcohol Use: 0.0 oz/week    0 Standard drinks or equivalent per week     Comment: rarely 2 beers a year  . Drug Use: No  . Sexual  Activity: Not Asked   Other Topics Concern  . None   Social History Narrative   Family: Married 16 years in 2016. No children. 1 dog- pit bull and lab mix. Lives wife.    2nd youngest of 46      Work: Magazine features editor for [employer]   HS degree      Hobbies: rest, deer and rabbit hunt    ROS--See HPI , otherwise full ROS was completed and negative except as noted above  Objective: BP 130/68 mmHg  Pulse 69  Temp(Src) 98.6 F (37 C)  Ht 6' (1.829 m)  Wt 229 lb (103.874 kg)  BMI 31.05 kg/m2 Gen: NAD, resting comfortably HEENT: Mucous membranes are moist. Oropharynx normal. TM normal. No thyromegaly Eyes: sclera and lids normal,  PERRLA Neck: no thyromegaly, no cervical lymphadenopathy CV: RRR no murmurs rubs or gallops Lungs: CTAB no crackles, wheeze, rhonchi Abdomen: soft/nontender/nondistended/normal bowel sounds. No rebound or guarding.  Ext: no edema Skin: warm, dry Neuro: 5/5 strength in upper and lower extremities, normal gait, normal reflexes   Assessment/Plan:  Hyperlipidemia S: poorly controlled off statin in the past. Statin- joint and muscle aches. Never done burst therapy. Failed zetia as well-myalgias. Lab Results  Component Value Date   CHOL 262* 03/03/2014   HDL 36 03/03/2014   LDLCALC 153 03/03/2014   TRIG 366* 03/03/2014  A/P: We discussed testing lipids before physical, if 10 year risk >7.5 % consider once weekly therapy with atorvastatin or crestor at higher dose to see if can avoid myalgias.    Carpal tunnel syndrome S: Wonders if neuropathy is progressing to his hands. Prior office notes mention carpal tunnel. Patient admits fifth finger is spared bilaterally. Issues in bilateral hands. ROS-no weakness. Does experience some numbness and tingling. Objective-reproduced symptoms with Tinel A/P: Advised patient to trial a wrist brace bilaterally. He already takes NSAIDs intermittently for his back. He has taken these for a week and did not notice improvement in the hands. If no improvement within a month we would consider referral to hand surgeon. Could also consider sports medicine  Asthma S:albuterol once every other week, likely cat allergy- as used to be multiple times a week before his cat died A/P: Continue when necessary albuterol. Well-controlled   Hereditary and idiopathic peripheral neuropathy S: Pain in bottom of feet, some issues in hands but may be carpal tunnel. Numbness on tips of fingers. Years of issues. States he has had workup in the past which was unrevealing. lyrica- didn't seem to help. Some improvement with Cymbalta. The thing that helps the most seems to be  gabapentin. But he gets fatigued and dry mouth with this. Gabapentin 300mg  BID- max tolerated dose. When he travels all the symptoms worsen. A/P: Reasonable control. Refilled Duloxetine 60mg  daily and gabapentin 300 mg twice a day   Reach out to [facility]- no copy of immunizations- Keba to reach out. Tdap next visit likely.  Check to find last colonoscopy report as well- does not appear kept follow up in 2014 6-9 months for CPE  Orders Placed This Encounter  Procedures  . Flu Vaccine QUAD 36+ mos IM    Meds ordered this encounter  Medications  . DULoxetine (CYMBALTA) 60 MG capsule    Sig: Take 1 capsule (60 mg total) by mouth daily.    Dispense:  30 capsule    Refill:  11  . gabapentin (NEURONTIN) 300 MG capsule    Sig: Take 1 capsule (300 mg total) by mouth  2 (two) times daily.    Dispense:  60 capsule    Refill:  11  . albuterol (PROVENTIL HFA;VENTOLIN HFA) 108 (90 BASE) MCG/ACT inhaler    Sig: Inhale 2 puffs into the lungs every 6 (six) hours as needed. For shortness of breath    Dispense:  6.7 g    Refill:  11   >50% of 50 minute office visit was spent on counseling (dealing with neuropathy and carpal tunnel and treatment options) and coordination of care (reviewing and summarizing records)

## 2015-02-14 NOTE — Assessment & Plan Note (Addendum)
S: poorly controlled off statin in the past. Statin- joint and muscle aches. Never done burst therapy. Failed zetia as well-myalgias. Lab Results  Component Value Date   CHOL 262* 03/03/2014   HDL 36 03/03/2014   LDLCALC 153 03/03/2014   TRIG 366* 03/03/2014  A/P: We discussed testing lipids before physical, if 10 year risk >7.5 % consider once weekly therapy with atorvastatin or crestor at higher dose to see if can avoid myalgias.

## 2015-02-14 NOTE — Assessment & Plan Note (Signed)
S: Wonders if neuropathy is progressing to his hands. Prior office notes mention carpal tunnel. Patient admits fifth finger is spared bilaterally. Issues in bilateral hands. ROS-no weakness. Does experience some numbness and tingling. Objective-reproduced symptoms with Tinel A/P: Advised patient to trial a wrist brace bilaterally. He already takes NSAIDs intermittently for his back. He has taken these for a week and did not notice improvement in the hands. If no improvement within a month we would consider referral to hand surgeon. Could also consider sports medicine

## 2015-02-14 NOTE — Patient Instructions (Signed)
Medication Instructions:  No changes Refills Meds ordered this encounter  Medications  . DULoxetine (CYMBALTA) 60 MG capsule    Sig: Take 1 capsule (60 mg total) by mouth daily.    Dispense:  30 capsule    Refill:  11  . gabapentin (NEURONTIN) 300 MG capsule    Sig: Take 1 capsule (300 mg total) by mouth 2 (two) times daily.    Dispense:  60 capsule    Refill:  11  . albuterol (PROVENTIL HFA;VENTOLIN HFA) 108 (90 BASE) MCG/ACT inhaler    Sig: Inhale 2 puffs into the lungs every 6 (six) hours as needed. For shortness of breath    Dispense:  6.7 g    Refill:  11   Other Instructions:  Would love to have you walking at least 30 minutes a day (build up slowly starting at 10 minutes) 5 days a week, you have an increased risk of diabetes  Wear wrist brace overnight for next month to see if that improves hand symptoms, we can refer you to hand surgeons if not for their opinion  Labwork: Come in a week before your physical for labs  Testing/Procedures/Immunizations: Flu shot today  Follow-Up (all visit scheduling, rescheduling, cancellations including labs should be scheduled at front desk): 6-9 months for a physical.   Sooner if you need [place] or if you have new or worsening symptoms

## 2015-02-14 NOTE — Assessment & Plan Note (Signed)
S: Pain in bottom of feet, some issues in hands but may be carpal tunnel. Numbness on tips of fingers. Years of issues. States he has had workup in the past which was unrevealing. lyrica- didn't seem to help. Some improvement with Cymbalta. The thing that helps the most seems to be gabapentin. But he gets fatigued and dry mouth with this. Gabapentin 300mg  BID- max tolerated dose. When he travels all the symptoms worsen. A/P: Reasonable control. Refilled Duloxetine 60mg  daily and gabapentin 300 mg twice a day

## 2015-06-04 MED FILL — GABAPENTIN 300 MG CAPSULE: 300 | 30 days supply | Qty: 60 | Fill #3

## 2015-07-02 MED FILL — GABAPENTIN 300 MG CAPSULE: 300 | 30 days supply | Qty: 60 | Fill #4

## 2015-08-06 MED FILL — DULoxetine HCL 60 MG CPEP: 60 | 30 days supply | Qty: 30 | Fill #0

## 2015-08-06 MED FILL — GABAPENTIN 300 MG CAPSULE: 300 | 30 days supply | Qty: 60 | Fill #5

## 2015-09-06 MED FILL — GABAPENTIN 300 MG CAPSULE: 300 | 30 days supply | Qty: 60 | Fill #6

## 2015-09-06 MED FILL — DULoxetine HCL 60 MG CPEP: 60 | 30 days supply | Qty: 30 | Fill #1

## 2015-10-04 MED FILL — DULoxetine HCL 60 MG CPEP: 60 | 30 days supply | Qty: 30 | Fill #2

## 2015-10-04 MED FILL — GABAPENTIN 300 MG CAPSULE: 300 | 30 days supply | Qty: 60 | Fill #7

## 2015-10-08 ENCOUNTER — Other Ambulatory Visit (INDEPENDENT_AMBULATORY_CARE_PROVIDER_SITE_OTHER): Payer: 59

## 2015-10-08 DIAGNOSIS — Z0000 Encounter for general adult medical examination without abnormal findings: Secondary | ICD-10-CM | POA: Diagnosis not present

## 2015-10-08 LAB — CBC WITH DIFFERENTIAL/PLATELET
BASOS PCT: 0.5 % (ref 0.0–3.0)
Basophils Absolute: 0 10*3/uL (ref 0.0–0.1)
EOS ABS: 0.2 10*3/uL (ref 0.0–0.7)
EOS PCT: 2.2 % (ref 0.0–5.0)
HEMATOCRIT: 43.2 % (ref 39.0–52.0)
HEMOGLOBIN: 14.5 g/dL (ref 13.0–17.0)
LYMPHS PCT: 44.9 % (ref 12.0–46.0)
Lymphs Abs: 4.1 10*3/uL — ABNORMAL HIGH (ref 0.7–4.0)
MCHC: 33.7 g/dL (ref 30.0–36.0)
MCV: 88.9 fl (ref 78.0–100.0)
MONO ABS: 0.6 10*3/uL (ref 0.1–1.0)
Monocytes Relative: 6.9 % (ref 3.0–12.0)
Neutro Abs: 4.2 10*3/uL (ref 1.4–7.7)
Neutrophils Relative %: 45.5 % (ref 43.0–77.0)
Platelets: 215 10*3/uL (ref 150.0–400.0)
RBC: 4.86 Mil/uL (ref 4.22–5.81)
RDW: 13.4 % (ref 11.5–15.5)
WBC: 9.2 10*3/uL (ref 4.0–10.5)

## 2015-10-08 LAB — LIPID PANEL
CHOL/HDL RATIO: 6
Cholesterol: 252 mg/dL — ABNORMAL HIGH (ref 0–200)
HDL: 39.8 mg/dL (ref >39.00)
LDL CALC: 172 mg/dL — AB (ref 0–99)
NONHDL: 212.28
Triglycerides: 200 mg/dL — ABNORMAL HIGH (ref 0.0–149.0)
VLDL: 40 mg/dL (ref 0.0–40.0)

## 2015-10-08 LAB — HEPATIC FUNCTION PANEL
ALT: 28 U/L (ref 0–53)
AST: 22 U/L (ref 0–37)
Albumin: 4.1 g/dL (ref 3.5–5.2)
Alkaline Phosphatase: 85 U/L (ref 39–117)
BILIRUBIN DIRECT: 0.1 mg/dL (ref 0.0–0.3)
BILIRUBIN TOTAL: 0.6 mg/dL (ref 0.2–1.2)
TOTAL PROTEIN: 6.9 g/dL (ref 6.0–8.3)

## 2015-10-08 LAB — BASIC METABOLIC PANEL
BUN: 13 mg/dL (ref 6–23)
CHLORIDE: 101 meq/L (ref 96–112)
CO2: 30 mEq/L (ref 19–32)
CREATININE: 0.9 mg/dL (ref 0.40–1.50)
Calcium: 9.6 mg/dL (ref 8.4–10.5)
GFR: 90.06 mL/min (ref >60.00)
Glucose, Bld: 100 mg/dL — ABNORMAL HIGH (ref 70–99)
POTASSIUM: 4.7 meq/L (ref 3.5–5.1)
Sodium: 137 mEq/L (ref 135–145)

## 2015-10-08 LAB — POC URINALSYSI DIPSTICK (AUTOMATED)
BILIRUBIN UA: NEGATIVE
Glucose, UA: NEGATIVE
KETONES UA: NEGATIVE
Leukocytes, UA: NEGATIVE
Nitrite, UA: NEGATIVE
PH UA: 7
RBC UA: NEGATIVE
SPEC GRAV UA: 1.02
Urobilinogen, UA: 0.2

## 2015-10-08 LAB — PSA: PSA: 1.31 ng/mL (ref 0.10–4.00)

## 2015-10-08 LAB — TSH: TSH: 2.31 u[IU]/mL (ref 0.35–4.50)

## 2015-10-15 ENCOUNTER — Encounter: Payer: Self-pay | Admitting: Family Medicine

## 2015-10-15 ENCOUNTER — Ambulatory Visit (INDEPENDENT_AMBULATORY_CARE_PROVIDER_SITE_OTHER): Payer: 59 | Admitting: Family Medicine

## 2015-10-15 VITALS — BP 132/70 | HR 84 | Temp 98.3°F | Ht 71.0 in | Wt 231.0 lb

## 2015-10-15 DIAGNOSIS — Z23 Encounter for immunization: Secondary | ICD-10-CM | POA: Diagnosis not present

## 2015-10-15 DIAGNOSIS — J4520 Mild intermittent asthma, uncomplicated: Secondary | ICD-10-CM

## 2015-10-15 DIAGNOSIS — G609 Hereditary and idiopathic neuropathy, unspecified: Secondary | ICD-10-CM

## 2015-10-15 DIAGNOSIS — E785 Hyperlipidemia, unspecified: Secondary | ICD-10-CM | POA: Diagnosis not present

## 2015-10-15 DIAGNOSIS — Z0001 Encounter for general adult medical examination with abnormal findings: Secondary | ICD-10-CM | POA: Diagnosis not present

## 2015-10-15 DIAGNOSIS — R739 Hyperglycemia, unspecified: Secondary | ICD-10-CM

## 2015-10-15 MED ORDER — ATORVASTATIN CALCIUM 20 MG PO TABS: 20.0000 mg | ORAL_TABLET | ORAL | Status: DC

## 2015-10-15 MED ORDER — GABAPENTIN 300 MG PO CAPS: ORAL_CAPSULE | ORAL | Status: DC

## 2015-10-15 MED ORDER — DULOXETINE HCL 60 MG PO CPEP: 60.0000 mg | ORAL_CAPSULE | Freq: Every day | ORAL | Status: DC

## 2015-10-15 MED FILL — ATORVASTATIN 20 MG TABLET: 20 | 84 days supply | Qty: 12 | Fill #0 | Status: TO

## 2015-10-15 NOTE — Assessment & Plan Note (Signed)
S: stable on gabapentin 300mg  twice a day and cymbalta 60mg . In past. Recent worsening in bilateral feet with chronic moderate pain and particularly at night. Also has issues in hand but carpal tunnel could contribute A/P: we will continue cymbalta at present dose and trial 600mg  gabapentin at night.

## 2015-10-15 NOTE — Progress Notes (Signed)
Phone: [phone]  Subjective:  Patient presents today for their annual physical. Chief complaint-noted.   See problem oriented charting- ROS- full  review of systems was completed and negative including No chest pain or shortness of breath. No headache or blurry vision.   The following were reviewed and entered/updated in epic: Past Medical History  Diagnosis Date  . Asthma     albuterol once every other week, likely cat allergy  . Chronic back pain     mild after back surgery L4 and L5 ruptured disc with surgery 2013  . GERD (gastroesophageal reflux disease)     omeprazole prn  . Hyperlipidemia     no rx  . Neuropathy (HCC)     gabapentin 300mg  BID   Patient Active Problem List   Diagnosis Date Noted  . Carpal tunnel syndrome 02/14/2015    Priority: Medium  . Hereditary and idiopathic peripheral neuropathy 02/14/2015    Priority: Medium  . Hyperglycemia 02/14/2015    Priority: Medium  . Asthma     Priority: Medium  . Hyperlipidemia     Priority: Medium  . Hearing loss 02/14/2015    Priority: Low  . GERD (gastroesophageal reflux disease)     Priority: Low  . Chronic back pain     Priority: Low   Past Surgical History  Procedure Laterality Date  . Colonscopy    . Lumbar laminectomy/decompression microdiscectomy  08/27/2011    Procedure: LUMBAR LAMINECTOMY/DECOMPRESSION MICRODISCECTOMY 2 LEVELS;  Surgeon: Floyce Stakes, MD;  Location: [facility];  Service: Neurosurgery;  Laterality: Left;  Left Lumbar four- five, five-sacral one Diskectomy    Family History  Problem Relation Age of Onset  . Anesthesia problems Neg Hx   . Hypercholesterolemia Mother     father, sisters, brothers  . Stroke Mother     late 66s, early 12s  . Diabetes Mother   . Heart disease Sister     CABG sister 34- heavy smoker   . Cancer Brother     Medications- reviewed and updated Current Outpatient Prescriptions  Medication Sig Dispense Refill  . albuterol (PROVENTIL  HFA;VENTOLIN HFA) 108 (90 BASE) MCG/ACT inhaler Inhale 2 puffs into the lungs every 6 (six) hours as needed. For shortness of breath 6.7 g 11  . docusate sodium (COLACE) 100 MG capsule Take 200 mg by mouth 2 (two) times daily.    . DULoxetine (CYMBALTA) 60 MG capsule Take 1 capsule (60 mg total) by mouth daily. 30 capsule 11  . gabapentin (NEURONTIN) 300 MG capsule Take 1 capsule (300 mg total) by mouth 2 (two) times daily. 60 capsule 11   No current facility-administered medications for this visit.    Allergies-reviewed and updated Allergies  Allergen Reactions  . Codeine Nausea And Vomiting    Social History   Social History  . Marital Status: Married    Spouse Name: N/A  . Number of Children: N/A  . Years of Education: N/A   Social History Main Topics  . Smoking status: Never Smoker   . Smokeless tobacco: None  . Alcohol Use: 0.0 oz/week    0 Standard drinks or equivalent per week     Comment: rarely 2 beers a year  . Drug Use: No  . Sexual Activity: Not Asked   Other Topics Concern  . None   Social History Narrative   Family: Married 16 years in 2016. No children. 1 dog- pit bull and lab mix. Lives wife.    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 Oropharynx normal Neck: no thyromegaly CV: RRR no murmurs rubs or gallops Lungs: CTAB no crackles, wheeze, rhonchi Abdomen: soft/nontender/nondistended/normal bowel sounds. No rebound or guarding.  Rectal: normal tone, normal size prostate, no masses or tenderness Ext: no edema Skin: warm, dry Neuro: grossly normal, moves all extremities, PERRLA  Assessment/Plan:  65 y.o. male presenting for annual physical.  Health  Maintenance counseling: 1. Anticipatory guidance: Patient counseled regarding regular dental exams, eye exams, wearing seatbelts.  2. Risk factor reduction:  Advised patient of need for regular exercise and diet rich and fruits and vegetables to reduce risk of heart attack and stroke.  3. Immunizations/screenings/ancillary studies Immunization History  Administered Date(s) Administered  . Influenza,inj,Quad PF,36+ Mos 02/14/2015  . Tdap 10/15/2015   Health Maintenance Due  Topic Date Due  . Hepatitis C Screening - next labs 08/30/1950  . HIV Screening - next labs 12/14/1965  . COLONOSCOPY - wants to wait until has medicare 12/14/2000  . ZOSTAVAX - check with insurance 12/15/2010   4. Prostate cancer screening- low risk based off rectal and PSA trend  Lab Results  Component Value Date   PSA 1.31 10/08/2015   PSA 1.58 01/24/2013   5. Colon cancer screening - waiting until on medicare- will call when switches 6. Skin cancer screening- saw Dr. Ronnald Ramp about a year ago- plans to go back  Status of chronic or acute concerns   1. Asthma- remains controlled with albuterol once every other week  2. Hyperlipidemia- see below   3. Hyperglycemia- see below  4. GERD- prilosec prn  5. Chronic back pain- no rx other than sparing ibuprofen or aleve  6. Neuropathy- see below  Hyperlipidemia S: poorly controlled on no medicatoin. No myalgias. Severe arthralgias on statins in past Lab Results  Component Value Date   CHOL 252* 10/08/2015   HDL 39.80 10/08/2015   LDLCALC 172* 10/08/2015   TRIG 200.0* 10/08/2015   CHOLHDL 6 10/08/2015   A/P: will trial once a week atorvastatin 20mg  to see how patient does. RIsk is certainly elevated for MI/CVA    Hyperglycemia Discussed increased risk for diabetes. Patient aware of changes he needs to make.   Hereditary and idiopathic peripheral neuropathy S: stable on gabapentin 300mg  twice a day and cymbalta 60mg . In past. Recent worsening in  bilateral feet with chronic moderate pain and particularly at night. Also has issues in hand but carpal tunnel could contribute A/P: we will continue cymbalta at present dose and trial 600mg  gabapentin at night.     Return in about 6 months (around 04/16/2016). Return precautions advised.   Orders Placed This Encounter  Procedures  . Tdap vaccine greater than or equal to 7yo IM    Meds ordered this encounter  Medications  . atorvastatin (LIPITOR) 20 MG tablet    Sig: Take 1 tablet (20 mg total) by mouth once a week.    Dispense:  14 tablet    Refill:  3  . DULoxetine (CYMBALTA) 60 MG capsule    Sig: Take 1 capsule (60 mg total) by mouth daily.    Dispense:  90 capsule    Refill:  3  . gabapentin (NEURONTIN) 300 MG capsule    Sig: Take 1 pill each morning and 2 pills each evening    Dispense:  270 capsule    Refill:  3    Garret Reddish, MD

## 2015-10-15 NOTE — Patient Instructions (Addendum)
Call your insurace about the shingles shot to see if it is covered or how much it would cost and where is cheaper (here or pharmacy).  If you want to receive here, call for nurse visit.  Call us when insurance changs so we can order your colonoscopy  Trial atorvastatin 20mg  once a week for cholesterol. May be able to avoid joint aches with infrequent dosing  Increased risk for diabetes. Encouraged need for healthy eating (3-5 servings of veggies a day, drink water only, watch portion size), regular exercise (150 minutes a week), weight loss.

## 2015-10-15 NOTE — Assessment & Plan Note (Signed)
Discussed increased risk for diabetes. Patient aware of changes he needs to make.

## 2015-10-15 NOTE — Assessment & Plan Note (Signed)
S: poorly controlled on no medicatoin. No myalgias. Severe arthralgias on statins in past Lab Results  Component Value Date   CHOL 252* 10/08/2015   HDL 39.80 10/08/2015   LDLCALC 172* 10/08/2015   TRIG 200.0* 10/08/2015   CHOLHDL 6 10/08/2015   A/P: will trial once a week atorvastatin 20mg  to see how patient does. RIsk is certainly elevated for MI/CVA

## 2015-11-01 MED FILL — GABAPENTIN 300 MG CAPSULE: 300 | 30 days supply | Qty: 60 | Fill #8

## 2015-11-06 MED FILL — DULoxetine HCL 60 MG CPEP: 60 | 30 days supply | Qty: 30 | Fill #3

## 2015-11-26 MED FILL — GABAPENTIN 300 MG CAPSULE: 300 | 90 days supply | Qty: 270 | Fill #0 | Status: TO

## 2015-12-10 MED FILL — DULoxetine HCL 60 MG CPEP: 60 | 90 days supply | Qty: 90 | Fill #0 | Status: TO

## 2016-04-11 ENCOUNTER — Ambulatory Visit (INDEPENDENT_AMBULATORY_CARE_PROVIDER_SITE_OTHER): Payer: Medicare Other

## 2016-04-11 DIAGNOSIS — Z23 Encounter for immunization: Secondary | ICD-10-CM | POA: Diagnosis not present

## 2016-05-21 ENCOUNTER — Encounter: Payer: Self-pay | Admitting: Family Medicine

## 2016-05-21 ENCOUNTER — Ambulatory Visit (INDEPENDENT_AMBULATORY_CARE_PROVIDER_SITE_OTHER): Payer: Medicare Other | Admitting: Family Medicine

## 2016-05-21 DIAGNOSIS — G5603 Carpal tunnel syndrome, bilateral upper limbs: Secondary | ICD-10-CM | POA: Diagnosis not present

## 2016-05-21 MED ORDER — PREDNISONE 20 MG PO TABS: 20.0000 mg | ORAL_TABLET | Freq: Every day | ORAL | 0 refills | Status: DC

## 2016-05-21 MED FILL — predniSONE 20 MG TABS: 20 | 7 days supply | Qty: 7 | Fill #0

## 2016-05-21 NOTE — Assessment & Plan Note (Addendum)
S: over a year of symptoms. First 4 fingers numb- drops objects at times because doesn't feel as well. Also shooting pains up to elbow on right side at night. Brace helped at first then made pain worse. Also has intermittent neck pain but not always along with this O: positive tinel's and phalens, spurling negative, decrease gross touch on 4th and ulnar side of 4th finger bilaterally. No weakness in arm or grip strength, large hands, no obvious change in facial features (doubt acromegaly) A/P: Suspect most of his symptoms are related to carpal tunnel but has failed therapy with bracing. Agrees to trial prednisone course (may help carpal tunnel and neck if neck related). Also agrees to sports medicine referral which was placed today- wonder if he may benefit from injections. This could also be related to his known idiopathic neuropathy.

## 2016-05-21 NOTE — Patient Instructions (Signed)
Trial 7 days of prednisone  We will call you within a week about your referral to sports medicine. If you do not hear within 2 weeks, give [place] a call.

## 2016-05-21 NOTE — Progress Notes (Signed)
Subjective:  Charles Oh Sr. is a 65 y.o. year old very pleasant male patient who presents for/with See problem oriented charting ROS- admits to hand pain, dropping objects due to numbness but not loss of grip strength, some pain into right arm > left arm.    Past Medical History-  Patient Active Problem List   Diagnosis Date Noted  . Carpal tunnel syndrome 02/14/2015    Priority: Medium  . Hereditary and idiopathic peripheral neuropathy 02/14/2015    Priority: Medium  . Hyperglycemia 02/14/2015    Priority: Medium  . Asthma     Priority: Medium  . Hyperlipidemia     Priority: Medium  . Hearing loss 02/14/2015    Priority: Low  . GERD (gastroesophageal reflux disease)     Priority: Low  . Chronic back pain     Priority: Low    Medications- reviewed and updated Current Outpatient Prescriptions  Medication Sig Dispense Refill  . albuterol (PROVENTIL HFA;VENTOLIN HFA) 108 (90 BASE) MCG/ACT inhaler Inhale 2 puffs into the lungs every 6 (six) hours as needed. For shortness of breath 6.7 g 11  . atorvastatin (LIPITOR) 20 MG tablet Take 1 tablet (20 mg total) by mouth once a week. 14 tablet 3  . docusate sodium (COLACE) 100 MG capsule Take 200 mg by mouth 2 (two) times daily.    . DULoxetine (CYMBALTA) 60 MG capsule Take 1 capsule (60 mg total) by mouth daily. 90 capsule 3  . gabapentin (NEURONTIN) 300 MG capsule Take 1 pill each morning and 2 pills each evening 270 capsule 3  . predniSONE (DELTASONE) 20 MG tablet Take 1 tablet (20 mg total) by mouth daily with breakfast. 7 tablet 0   No current facility-administered medications for this visit.     Objective: BP (!) 128/58 (BP Location: Left Arm, Patient Position: Sitting, Cuff Size: Large)   Pulse 82   Temp 98.4 F (36.9 C) (Oral)   Ht 5\' 11"  (1.803 m)   Wt 222 lb (100.7 kg)   SpO2 96%   BMI 30.96 kg/m  Gen: NAD, resting comfortably CV: RRR no murmurs rubs or gallops Lungs: CTAB no crackles, wheeze, rhonchi  Skin:  warm, dry Neuro: 5/5 strength in upper extremities including grip strength msk see below  Assessment/Plan:  Carpal tunnel syndrome S: over a year of symptoms. First 4 fingers numb- drops objects at times because doesn't feel as well. Also shooting pains up to elbow on right side at night. Brace helped at first then made pain worse. Also has intermittent neck pain but not always along with this O: positive tinel's and phalens, spurling negative, decrease gross touch on 4th and ulnar side of 4th finger bilaterally. No weakness in arm or grip strength, large hands, no obvious change in facial features (doubt acromegaly) A/P: Suspect most of his symptoms are related to carpal tunnel but has failed therapy with bracing. Agrees to trial prednisone course (may help carpal tunnel and neck if neck related). Also agrees to sports medicine referral which was placed today- wonder if he may benefit from injections. This could also be related to his known idiopathic neuropathy.  Orders Placed This Encounter  Procedures  . Ambulatory referral to Sports Medicine    Referral Priority:   Routine    Referral Type:   Consultation    Number of Visits Requested:   1    Meds ordered this encounter  Medications  . predniSONE (DELTASONE) 20 MG tablet    Sig: Take 1  tablet (20 mg total) by mouth daily with breakfast.    Dispense:  7 tablet    Refill:  0    Return precautions advised.  Garret Reddish, MD

## 2016-05-21 NOTE — Progress Notes (Signed)
Pre visit review using our clinic review tool, if applicable. No additional management support is needed unless otherwise documented below in the visit note. 

## 2016-06-11 NOTE — Progress Notes (Addendum)
[name] [address], [place] [address] Phone: [phone] Subjective:    I'm seeing this patient by the request  of:  Garret Reddish, MD   CC: Bilateral wrist pain  [id number]  Charles Beebe. is a 66 y.o. male coming in with complaint of bilateral wrist pain. Patient states that he has had the symptoms for over a year. Patient states it seems to be more from the thumb through the ring finger bilaterally. States that recently and starting having worsening pain, constant numbness, and now starting to drop things on a more frequent basis. Some neck pain but not always having neck pain without a continues to have the problem. Patient states that he has done in certain therapy with range of motion as well as bracing. Patient was given a trial of prednisone. Referred here also for further evaluation. Patient is a the prednisone did not seem to make any same pattern difference. Patient states that he continues to have the numbness. States that the pain seems to be relatively bearable low. Patient denies any worsening symptoms at night. Does have some mild neck pain as stated above but does not think is related.     Past Medical History:  Diagnosis Date  . Asthma    albuterol once every other week, likely cat allergy  . Chronic back pain    mild after back surgery L4 and L5 ruptured disc with surgery 2013  . GERD (gastroesophageal reflux disease)    omeprazole prn  . Hyperlipidemia    no rx  . Neuropathy (HCC)    gabapentin 300mg  BID   Past Surgical History:  Procedure Laterality Date  . Colonscopy    . LUMBAR LAMINECTOMY/DECOMPRESSION MICRODISCECTOMY  08/27/2011   Procedure: LUMBAR LAMINECTOMY/DECOMPRESSION MICRODISCECTOMY 2 LEVELS;  Surgeon: Floyce Stakes, MD;  Location: [facility];  Service: Neurosurgery;  Laterality: Left;  Left Lumbar four- five, five-sacral one Diskectomy   Social History   Social History  . Marital  status: Married    Spouse name: N/A  . Number of children: N/A  . Years of education: N/A   Social History Main Topics  . Smoking status: Never Smoker  . Smokeless tobacco: None  . Alcohol use 0.0 oz/week     Comment: rarely 2 beers a year  . Drug use: No  . Sexual activity: Not Asked   Other Topics Concern  . None   Social History Narrative   Family: Married 16 years in 2016. No children. 1 dog- pit bull and lab mix. Lives wife.    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 Codeine Nausea And Vomiting   Family History  Problem Relation Age of Onset  . Anesthesia problems Neg Hx   . Hypercholesterolemia Mother     father, sisters, brothers  . Stroke Mother     late 3s, early 68s  . Diabetes Mother   . Heart disease Sister     CABG sister 14- heavy smoker   . Cancer Brother     Past medical history, social, surgical and family history all reviewed in electronic medical record.  No pertanent information unless stated regarding to the chief complaint.   Review of Systems:Review of systems updated and as accurate as of 06/12/16  No headache, visual  changes, nausea, vomiting, diarrhea, constipation, dizziness, abdominal pain, skin rash, fevers, chills, night sweats, weight loss, swollen lymph nodes, body aches, joint swelling, muscle aches, chest pain, shortness of breath, mood changes.   Objective  Blood pressure 130/78, pulse 68, height 6' (1.829 m), weight 226 lb 12.8 oz (102.9 kg). Systems examined below as of 06/12/16   General: No apparent distress alert and oriented x3 mood and affect normal, dressed appropriately.  HEENT: Pupils equal, extraocular movements intact  Respiratory: Patient's speak in full sentences and does not appear short of breath  Cardiovascular: No lower extremity edema, non tender, no erythema  Skin: Warm dry intact with no  signs of infection or rash on extremities or on axial skeleton.  Abdomen: Soft nontender  Neuro: Cranial nerves II through XII are intact, neurovascularly intact in all extremities with 2+ DTRs and 2+ pulses.  Lymph: No lymphadenopathy of posterior or anterior cervical chain or axillae bilaterally.  Gait normal with good balance and coordination.  MSK:  Non tender with full range of motion and good stability and symmetric strength and tone of shoulders, elbows, hip, knee and ankles bilaterally. Mild arthritic changes of multiple joints Wrist: Bilateral Arthritic changes of joints noted. ROM smooth and normal with good flexion and extension and ulnar/radial deviation that is symmetrical with opposite wrist. Palpation is normal over metacarpals, navicular, lunate, and TFCC; tendons without tenderness/ swelling No snuffbox tenderness. No tenderness over Canal of Gu[place]. Strength 5/5 in all directions without pain. Negative Finkelstein, positive tinel's and phalens. Negative Watson's test. Grip strength bilaterally is 4 out of 5.  Neck: Inspection unremarkable. No palpable stepoffs. Negative Spurling's maneuver. Decreased range of motion lacking the last 15 of extension. Minimal sidebending bilaterally. Grip strength seems to be moderately weak compared to strengthen other extremity. Decreased sensation in the median nerve distribution of the hands bilaterally Negative Hoffman sign bilaterally Reflexes normal  MSK US performed of: Bilateral wrist This study was ordered, performed, and interpreted by Charlann Boxer D.O.  Wrist: Limited ultrasound focused on patient's carpal tunnel. Patient was found to have a 1.6 cm diameter on the left and at 2.4 cm diameter on the right. This is minor enlargement   IMPRESSION: Mild to moderate carpal tunnel  Procedure: Real-time Ultrasound Guided Injection of right carpal tunnel Device: GE Logiq E  Ultrasound guided injection is preferred based studies  that show increased duration, increased effect, greater accuracy, decreased procedural pain, increased response rate with ultrasound guided versus blind injection.  Verbal informed consent obtained.  Time-out conducted.  Noted no overlying erythema, induration, or other signs of local infection.  Skin prepped in a sterile fashion.  Local anesthesia: Topical Ethyl chloride.  With sterile technique and under real time ultrasound guidance:  median nerve visualized.  23g 5/8 inch needle inserted distal to proximal approach into nerve sheath. Pictures taken nfor needle placement. Patient did have injection of 2 cc of 1% lidocaine, 1 cc of 0.5% Marcaine, and 1 cc of Kenalog 40 mg/dL. Completed without difficulty  Pain immediately resolved suggesting accurate placement of the medication.  Advised to call if fevers/chills, erythema, induration, drainage, or persistent bleeding.  Images permanently stored and available for review in the ultrasound unit.  Impression: Technically successful ultrasound guided injection.   Procedure: Real-time Ultrasound Guided Injection of left carpal tunnel Device: GE Logiq E  Ultrasound guided injection is preferred based studies that show increased duration, increased effect, greater accuracy, decreased procedural pain, increased response rate with ultrasound guided versus blind injection.  Verbal informed consent obtained.  Time-out conducted.  Noted no overlying erythema, induration, or other signs of local infection.  Skin prepped in a sterile fashion.  Local anesthesia: Topical Ethyl chloride.  With sterile technique and under real time ultrasound guidance:  median nerve visualized.  23g 5/8 inch needle inserted distal to proximal approach into nerve sheath. Pictures taken for needle placement. Patient did have injection of 2 cc of 1% lidocaine, 1 cc of 0.5% Marcaine, and 1 cc of Kenalog 40 mg/dL. Completed without difficulty  Pain immediately resolved suggesting  accurate placement of the medication.  Advised to call if fevers/chills, erythema, induration, drainage, or persistent bleeding.  Images permanently stored and available for review in the ultrasound unit.  Impression: Technically successful ultrasound guided injection.      Impression and Recommendations:     This case required medical decision making of moderate complexity.      Note: This dictation was prepared with Dragon dictation along with smaller phrase technology. Any transcriptional errors that result from this process are unintentional.

## 2016-06-12 ENCOUNTER — Ambulatory Visit (INDEPENDENT_AMBULATORY_CARE_PROVIDER_SITE_OTHER): Payer: Medicare Other | Admitting: Family Medicine

## 2016-06-12 ENCOUNTER — Encounter: Payer: Self-pay | Admitting: Family Medicine

## 2016-06-12 ENCOUNTER — Ambulatory Visit: Payer: Self-pay

## 2016-06-12 ENCOUNTER — Ambulatory Visit (INDEPENDENT_AMBULATORY_CARE_PROVIDER_SITE_OTHER)
Admission: RE | Admit: 2016-06-12 | Discharge: 2016-06-12 | Disposition: A | Discharge status: Home / Self Care | Payer: Medicare Other | Source: Ambulatory Visit | Attending: Family Medicine | Admitting: Family Medicine

## 2016-06-12 VITALS — BP 130/78 | HR 68 | Ht 72.0 in | Wt 226.8 lb

## 2016-06-12 DIAGNOSIS — M25539 Pain in unspecified wrist: Secondary | ICD-10-CM | POA: Diagnosis not present

## 2016-06-12 DIAGNOSIS — M542 Cervicalgia: Secondary | ICD-10-CM

## 2016-06-12 DIAGNOSIS — G5603 Carpal tunnel syndrome, bilateral upper limbs: Secondary | ICD-10-CM

## 2016-06-12 MED ORDER — GABAPENTIN 300 MG PO CAPS: ORAL_CAPSULE | ORAL | 3 refills | Status: DC

## 2016-06-12 MED FILL — GABAPENTIN 300 MG CAPSULE: 300 | 90 days supply | Qty: 360 | Fill #0

## 2016-06-12 NOTE — Assessment & Plan Note (Addendum)
Patient  was given an injection bilaterally today. Tolerated the procedure well. With patient having constant numbness and mild weakness of the hands I do think the patient should likely have an EMG. At this moment patient would like to hold on this. I do believe the patient also stating that he has some peripheral neuropathy in the feet that further workup may be necessary. Encourage patient to take the gabapentin increased no stiff 300 mg in a.m., 300 mg in p.m., and 600 mg at night. We discussed icing regimen. Discussed bracing at night. Depending on how patient response to these injections we may need to consider further evaluation with laboratory workup as well as EMG.

## 2016-06-12 NOTE — Patient Instructions (Addendum)
Good to see you.  Ice 20 minutes 2 times daily. Usually after activity and before bed. Exercises 3 times a week.   pennsaid pinkie amount topically 2 times daily as needed.  Try to wear the braces you have at night.  Neck xray downstairs.  Lets increase gabapentin to 300mg  in AM, 300mg  in Pm and 600mg  at night Send message on Monday and if not better we should get an EMG to see where the numbness is coming from

## 2016-06-13 ENCOUNTER — Other Ambulatory Visit: Payer: Self-pay | Admitting: Family Medicine

## 2016-06-16 ENCOUNTER — Encounter: Payer: Self-pay | Admitting: Family Medicine

## 2016-07-23 MED FILL — FLUOCINONIDE 0.05% GEL: 0.05 | 10 days supply | Qty: 15 | Fill #0

## 2016-08-11 ENCOUNTER — Other Ambulatory Visit: Payer: Self-pay

## 2016-08-11 ENCOUNTER — Telehealth: Payer: Self-pay | Admitting: Family Medicine

## 2016-08-11 MED ORDER — DULOXETINE HCL 60 MG PO CPEP: 60.0000 mg | ORAL_CAPSULE | Freq: Every day | ORAL | 3 refills | Status: DC

## 2016-08-11 MED ORDER — GABAPENTIN 300 MG PO CAPS: ORAL_CAPSULE | ORAL | 3 refills | Status: DC

## 2016-08-11 MED ORDER — ATORVASTATIN CALCIUM 20 MG PO TABS: 20.0000 mg | ORAL_TABLET | ORAL | 3 refills | Status: DC

## 2016-08-11 NOTE — Telephone Encounter (Signed)
Pt has changed plans to [facility] and need the following prescriptions sent atorvastatin,duloxetine and gabapentin.

## 2016-08-11 NOTE — Telephone Encounter (Signed)
Prescriptions sent to pharmacy as requested 

## 2016-08-25 ENCOUNTER — Other Ambulatory Visit: Payer: Self-pay | Admitting: Family Medicine

## 2016-08-25 MED ORDER — ATORVASTATIN CALCIUM 20 MG PO TABS: 20.0000 mg | ORAL_TABLET | ORAL | 3 refills | Status: DC

## 2016-08-25 MED ORDER — GABAPENTIN 300 MG PO CAPS: ORAL_CAPSULE | ORAL | 3 refills | Status: DC

## 2016-08-25 MED ORDER — DULOXETINE HCL 60 MG PO CPEP: 60.0000 mg | ORAL_CAPSULE | Freq: Every day | ORAL | 3 refills | Status: DC

## 2016-09-09 MED FILL — FLUOCINONIDE 0.05% GEL: 0.05 | 10 days supply | Qty: 15 | Fill #0

## 2016-09-15 ENCOUNTER — Encounter: Payer: Self-pay | Admitting: Family Medicine

## 2016-09-15 ENCOUNTER — Ambulatory Visit (INDEPENDENT_AMBULATORY_CARE_PROVIDER_SITE_OTHER): Payer: Medicare Other | Admitting: Family Medicine

## 2016-09-15 VITALS — BP 140/76 | HR 76 | Temp 98.1°F | Wt 232.2 lb

## 2016-09-15 DIAGNOSIS — L439 Lichen planus, unspecified: Secondary | ICD-10-CM

## 2016-09-15 MED ORDER — PREDNISONE 10 MG PO TABS: ORAL_TABLET | ORAL | 0 refills | Status: DC

## 2016-09-15 MED FILL — predniSONE 10 MG TABS: 10 | 8 days supply | Qty: 20 | Fill #0

## 2016-09-15 NOTE — Patient Instructions (Addendum)
Please take medication as directed and follow up if symptoms do not improve with treatment, worsen, or you develop new symptoms. As discussed, an oral health professional can be considered if this does not improve with treatment.   Lichen Planus Lichen planus is a skin problem. It causes redness, itching, small bumps, and sores. Areas of the body that are often affected include:  Arms, wrists, legs, or ankles.  Chest, back, or belly (abdomen).  Genital areas such as the vulva and vagina.  Gums and inside of the mouth.  Scalp.  Fingernails or toenails. Treatment can help to control symptoms. This condition is not passed from one person to another (not contagious). It can last for a long time. Follow these instructions at home:  Take over-the-counter and prescription medicines only as told by your doctor.  Use creams or ointments as told by your doctor.  Do not scratch the affected areas of skin.  Women should keep the vagina as clean and dry as they can. Contact a doctor if:  Your redness, swelling, or pain gets worse.  You have fluid, blood, or pus coming from the affected area.  Your eyes become irritated. This information is not intended to replace advice given to you by your health care provider. Make sure you discuss any questions you have with your health care provider. Document Released: 05/01/2008 Document Revised: 10/25/2015 Document Reviewed: 08/14/2014 Elsevier Interactive Patient Education  2017 [place] NOW OFFER   [facility] FAST TRACK!!!  SAME DAY Appointments for ACUTE CARE  Such as: Sprains, Injuries, cuts, abrasions, rashes, muscle pain, joint pain, back pain Colds, flu, sore throats, headache, allergies, cough, fever  Ear pain, sinus and eye infections Abdominal pain, nausea, vomiting, diarrhea, upset stomach Animal/insect bites  3 Easy Ways to Schedule: Walk-In Scheduling Call in scheduling Mychart Sign-up:  [email or web address]

## 2016-09-15 NOTE — Progress Notes (Signed)
Pre visit review using our clinic review tool, if applicable. No additional management support is needed unless otherwise documented below in the visit note. 

## 2016-09-15 NOTE — Progress Notes (Signed)
Subjective:    Patient ID: Charles Edmonston., male    DOB: [birth year], 66 y.o.   MRN: [id number]  HPI  [name] is a 66 year old male who presents today with burning in his mouth/cheeks when drinking a hot beverage that has been present for approximately 5 weeks. Saw dentist 6 weeks ago for dental work and he was evaluated for oral burning/discomfort and treated with fluocinonide gel after report of burning in mouth for 3 weeks that has provided minimal benefit.  Associated burning when drinking something hot or eating salty potato chips. He denies fever, chills, sweats, oral lesions, congestion, sinus pressure/pain, rhinitis, sore throat, or oral pain. He is not a smoker Symptoms are not worsening and he reports that there is some improvement with burning at this time.  No alleviating factors noted. No change in appetite and he reports feeling well otherwise.   Review of Systems  Constitutional: Negative for chills, fatigue and fever.  HENT:       White areas on inside of cheeks bilaterally  Respiratory: Negative for cough, shortness of breath and wheezing.   Cardiovascular: Negative for chest pain and palpitations.  Gastrointestinal: Negative for abdominal pain, diarrhea, nausea and vomiting.  Musculoskeletal: Negative for joint swelling.  Skin: Negative for rash.  Neurological: Negative for dizziness, light-headedness, numbness and headaches.   Past Medical History:  Diagnosis Date  . Asthma    albuterol once every other week, likely cat allergy  . Chronic back pain    mild after back surgery L4 and L5 ruptured disc with surgery 2013  . GERD (gastroesophageal reflux disease)    omeprazole prn  . Hyperlipidemia    no rx  . Neuropathy    gabapentin 300mg  BID     Social History   Social History  . Marital status: Married    Spouse name: N/A  . Number of children: N/A  . Years of education: N/A   Occupational History  . Not on file.   Social History Main  Topics  . Smoking status: Never Smoker  . Smokeless tobacco: Never Used  . Alcohol use 0.0 oz/week     Comment: rarely 2 beers a year  . Drug use: No  . Sexual activity: Not on file   Other Topics Concern  . Not on file   Social History Narrative   Family: Married 16 years in 2016. No children. 1 dog- pit bull and lab mix. Lives wife.    2nd youngest of 52      Work: Magazine features editor for [employer]   HS degree      Hobbies: rest, deer and rabbit hunt    Past Surgical History:  Procedure Laterality Date  . Colonscopy    . LUMBAR LAMINECTOMY/DECOMPRESSION MICRODISCECTOMY  08/27/2011   Procedure: LUMBAR LAMINECTOMY/DECOMPRESSION MICRODISCECTOMY 2 LEVELS;  Surgeon: Floyce Stakes, MD;  Location: [facility];  Service: Neurosurgery;  Laterality: Left;  Left Lumbar four- five, five-sacral one Diskectomy    Family History  Problem Relation Age of Onset  . Anesthesia problems Neg Hx   . Hypercholesterolemia Mother     father, sisters, brothers  . Stroke Mother     late 73s, early 79s  . Diabetes Mother   . Heart disease Sister     CABG sister 53- heavy smoker   . Cancer Brother     Allergies  Allergen Reactions  . Codeine Nausea And Vomiting    Current Outpatient Prescriptions on File  Prior to Visit  Medication Sig Dispense Refill  . albuterol (PROVENTIL HFA;VENTOLIN HFA) 108 (90 BASE) MCG/ACT inhaler Inhale 2 puffs into the lungs every 6 (six) hours as needed. For shortness of breath 6.7 g 11  . atorvastatin (LIPITOR) 20 MG tablet Take 1 tablet (20 mg total) by mouth once a week. 14 tablet 3  . docusate sodium (COLACE) 100 MG capsule Take 200 mg by mouth 2 (two) times daily.    . DULoxetine (CYMBALTA) 60 MG capsule Take 1 capsule (60 mg total) by mouth daily. 90 capsule 3  . gabapentin (NEURONTIN) 300 MG capsule Take 1 pill each morning, 1 in PM and 2 pills each evening 360 capsule 3   No current facility-administered medications on file prior to visit.      BP 140/76 (BP Location: Left Arm, Patient Position: Sitting, Cuff Size: Normal)   Pulse 76   Temp 98.1 F (36.7 C) (Oral)   Wt 232 lb 3.2 oz (105.3 kg)   SpO2 96%   BMI 31.49 kg/m        Objective:   Physical Exam  Constitutional: He is oriented to person, place, and time. He appears well-developed and well-nourished.  HENT:  Lace like flat white pattern on buccal mucosa bilaterally. No vesicular or erosive lesion present. White pattern did not wipe off with a 2 x 2. No pain noted by patient when wiping area  Eyes: No scleral icterus.  Neck: Neck supple.  Cardiovascular: Normal rate and regular rhythm.   Pulmonary/Chest: Effort normal and breath sounds normal. He has no rales.  Lymphadenopathy:    He has no cervical adenopathy.  Neurological: He is alert and oriented to person, place, and time. Coordination normal.  Skin: Skin is warm and dry. No rash noted.  Psychiatric: He has a normal mood and affect. His behavior is normal. Judgment and thought content normal.       Assessment & Plan:  1. Lichen planus No erosive or vesicular lesion; will not wipe off; lace like appearance with trial of topical fluocinonide gel by dentist; suspect lace like appearance is lichen planus. Advised use of oral prednisone and follow up with dental provider if symptom does not improve with treatment. - predniSONE (DELTASONE) 10 MG tablet; Take 4 tablets once daily for 2 days, 3 tabs daily for 2 days, 2 tabs daily for 2 days, 1 tab daily for 2 days.  Dispense: 20 tablet; Refill: 0  Delano Metz, FNP-C

## 2016-09-18 ENCOUNTER — Ambulatory Visit: Payer: Medicare Other | Admitting: Family Medicine

## 2016-10-23 ENCOUNTER — Ambulatory Visit (INDEPENDENT_AMBULATORY_CARE_PROVIDER_SITE_OTHER): Payer: Medicare Other | Admitting: Family Medicine

## 2016-10-23 ENCOUNTER — Encounter: Payer: Self-pay | Admitting: Family Medicine

## 2016-10-23 DIAGNOSIS — L439 Lichen planus, unspecified: Secondary | ICD-10-CM

## 2016-10-23 MED ORDER — PREDNISONE 20 MG PO TABS: 40.0000 mg | ORAL_TABLET | Freq: Every day | ORAL | 0 refills | Status: DC

## 2016-10-23 MED FILL — predniSONE 20 MG TABS: 20 | 15 days supply | Qty: 30 | Fill #0

## 2016-10-23 NOTE — Progress Notes (Signed)
Subjective:  Charles Pryor Sr. is a 66 y.o. year old very pleasant male patient who presents for/with See problem oriented charting ROS- no fever, chills, nausea, vomiting. Juts burning in mouth with whitish discoloration presenting again   Past Medical History-  Patient Active Problem List   Diagnosis Date Noted  . Carpal tunnel syndrome 02/14/2015    Priority: Medium  . Hereditary and idiopathic peripheral neuropathy 02/14/2015    Priority: Medium  . Hyperglycemia 02/14/2015    Priority: Medium  . Asthma     Priority: Medium  . Hyperlipidemia     Priority: Medium  . Hearing loss 02/14/2015    Priority: Low  . GERD (gastroesophageal reflux disease)     Priority: Low  . Chronic back pain     Priority: Low  . Lichen planus 40/97/3532    Medications- reviewed and updated Current Outpatient Prescriptions  Medication Sig Dispense Refill  . albuterol (PROVENTIL HFA;VENTOLIN HFA) 108 (90 BASE) MCG/ACT inhaler Inhale 2 puffs into the lungs every 6 (six) hours as needed. For shortness of breath 6.7 g 11  . atorvastatin (LIPITOR) 20 MG tablet Take 1 tablet (20 mg total) by mouth once a week. 14 tablet 3  . docusate sodium (COLACE) 100 MG capsule Take 200 mg by mouth 2 (two) times daily.    . DULoxetine (CYMBALTA) 60 MG capsule Take 1 capsule (60 mg total) by mouth daily. 90 capsule 3  . gabapentin (NEURONTIN) 300 MG capsule Take 1 pill each morning, 1 in PM and 2 pills each evening 360 capsule 3   No current facility-administered medications for this visit.     Objective: BP (!) 110/58 (BP Location: Left Arm, Patient Position: Sitting, Cuff Size: Large)   Pulse 85   Temp 98.2 F (36.8 C) (Oral)   Ht 6' (1.829 m)   Wt 230 lb 9.6 oz (104.6 kg)   SpO2 95%   BMI 31.27 kg/m  Gen: NAD, resting comfortably Buccal mucosa with mild erythematous base but also lace like whitish appearance which cannot be wiped off.  CV: RRR no murmurs rubs or gallops Lungs: CTAB no crackles,  wheeze, rhonchi Ext: no edema Skin: warm, dry  Assessment/Plan:  Lichen planus    Lichen planus S:  Patient seen by our office about a month ago for burning in his mouth/cheeks when drinking a hot beverage or salty potato chips - at that time was about 6 weeks of symptoms. Dentist evaluated him and referred to oral surgeon who treated him with fluocinonide gel. Symptoms had slightly improved at time of visit with [place].    ON exam at that time thought lichen planus with lace like appearance that did not wipe off. Oral prednisone was tried with advisement of following up with dentist for repeat evaluation. Started to go away but then returned after stopping the medicine. Thinks he had at least 50% improvement A/P: Reviewed up tod ate guideliness. One study showed 70% of patients treated with 40-80mg  prednisone once daily achieved remision within 26 days. Since he had a good response last time, we are going to trial 15 day of 40 mg then see patient back at that time to decide if we should continue or taper off. We discussed diabetes and blood pressure risks. Goal would be to taper medicine off within 4 weeks while starting fluocinonide gel back again to help maintain remission. We also discussed option of returning to oral surgeon for opinion but appears lichen planus in mouth can be refractory.  No obvious signs of cancer.  also discussed weight gain concern  2 weeks  Meds ordered this encounter  Medications  . predniSONE (DELTASONE) 20 MG tablet    Sig: Take 2 tablets (40 mg total) by mouth daily with breakfast. Take 2 pills for 3 days, 1 pill for 4 days    Dispense:  30 tablet    Refill:  0    Return precautions advised.  Garret Reddish, MD

## 2016-10-23 NOTE — Assessment & Plan Note (Signed)
S:  Patient seen by our office about a month ago for burning in his mouth/cheeks when drinking a hot beverage or salty potato chips - at that time was about 6 weeks of symptoms. Dentist evaluated him and referred to oral surgeon who treated him with fluocinonide gel. Symptoms had slightly improved at time of visit with [place].    ON exam at that time thought lichen planus with lace like appearance that did not wipe off. Oral prednisone was tried with advisement of following up with dentist for repeat evaluation. Started to go away but then returned after stopping the medicine. Thinks he had at least 50% improvement A/P: Reviewed up tod ate guideliness. One study showed 70% of patients treated with 40-80mg  prednisone once daily achieved remision within 26 days. Since he had a good response last time, we are going to trial 15 day of 40 mg then see patient back at that time to decide if we should continue or taper off. We discussed diabetes and blood pressure risks. Goal would be to taper medicine off within 4 weeks while starting fluocinonide gel back again to help maintain remission. We also discussed option of returning to oral surgeon for opinion but appears lichen planus in mouth can be refractory. No obvious signs of cancer.

## 2016-10-23 NOTE — Patient Instructions (Addendum)
We are going to trial higher dose steroids for longer to see if we can knock this down. This will raise blood sugars short term so there is some risk.   I want to see you back in 2 weeks to reevaluate. I will likely then start a taper off of prednisone and may use a topical steroid again for maintainence

## 2016-11-07 ENCOUNTER — Ambulatory Visit (INDEPENDENT_AMBULATORY_CARE_PROVIDER_SITE_OTHER): Payer: Medicare Other | Admitting: Family Medicine

## 2016-11-07 ENCOUNTER — Encounter: Payer: Self-pay | Admitting: Family Medicine

## 2016-11-07 VITALS — BP 118/60 | HR 83 | Temp 98.7°F | Ht 72.0 in | Wt 229.6 lb

## 2016-11-07 DIAGNOSIS — L439 Lichen planus, unspecified: Secondary | ICD-10-CM

## 2016-11-07 LAB — CBC WITH DIFFERENTIAL/PLATELET
Basophils Absolute: 0 10*3/uL (ref 0.0–0.1)
Basophils Relative: 0.2 % (ref 0.0–3.0)
EOS PCT: 0 % (ref 0.0–5.0)
Eosinophils Absolute: 0 10*3/uL (ref 0.0–0.7)
HCT: 43.4 % (ref 39.0–52.0)
Hemoglobin: 14.3 g/dL (ref 13.0–17.0)
LYMPHS ABS: 1.6 10*3/uL (ref 0.7–4.0)
Lymphocytes Relative: 11.9 % — ABNORMAL LOW (ref 12.0–46.0)
MCHC: 33 g/dL (ref 30.0–36.0)
MCV: 90.5 fl (ref 78.0–100.0)
MONO ABS: 0.2 10*3/uL (ref 0.1–1.0)
MONOS PCT: 1.6 % — AB (ref 3.0–12.0)
NEUTROS ABS: 11.7 10*3/uL — AB (ref 1.4–7.7)
Platelets: 224 10*3/uL (ref 150.0–400.0)
RBC: 4.8 Mil/uL (ref 4.22–5.81)
RDW: 13.6 % (ref 11.5–15.5)
WBC: 13.6 10*3/uL — ABNORMAL HIGH (ref 4.0–10.5)

## 2016-11-07 LAB — COMPREHENSIVE METABOLIC PANEL
ALT: 23 U/L (ref 0–53)
AST: 16 U/L (ref 0–37)
Albumin: 4.3 g/dL (ref 3.5–5.2)
Alkaline Phosphatase: 83 U/L (ref 39–117)
BUN: 17 mg/dL (ref 6–23)
CO2: 28 meq/L (ref 19–32)
Calcium: 9.9 mg/dL (ref 8.4–10.5)
Chloride: 103 mEq/L (ref 96–112)
Creatinine, Ser: 1.05 mg/dL (ref 0.40–1.50)
GFR: 75.13 mL/min (ref >60.00)
GLUCOSE: 190 mg/dL — AB (ref 70–99)
POTASSIUM: 4.1 meq/L (ref 3.5–5.1)
Sodium: 139 mEq/L (ref 135–145)
TOTAL PROTEIN: 6.8 g/dL (ref 6.0–8.3)
Total Bilirubin: 0.4 mg/dL (ref 0.2–1.2)

## 2016-11-07 MED ORDER — PREDNISONE 10 MG PO TABS: ORAL_TABLET | ORAL | 0 refills | Status: DC

## 2016-11-07 MED FILL — predniSONE 10 MG TABS: 10 | 12 days supply | Qty: 20 | Fill #0

## 2016-11-07 NOTE — Progress Notes (Signed)
Subjective:  Zaidyn Claire Sr. is a 66 y.o. year old very pleasant male patient who presents for/with See problem oriented charting ROS-no fevers, chills, fatigue/malaise, nausea/vomiting, or recent weight change    Past Medical History-  Patient Active Problem List   Diagnosis Date Noted  . Carpal tunnel syndrome 02/14/2015    Priority: Medium  . Hereditary and idiopathic peripheral neuropathy 02/14/2015    Priority: Medium  . Hyperglycemia 02/14/2015    Priority: Medium  . Asthma     Priority: Medium  . Hyperlipidemia     Priority: Medium  . Hearing loss 02/14/2015    Priority: Low  . GERD (gastroesophageal reflux disease)     Priority: Low  . Chronic back pain     Priority: Low  . Lichen planus 95/01/3266    Medications- reviewed and updated Current Outpatient Prescriptions  Medication Sig Dispense Refill  . albuterol (PROVENTIL HFA;VENTOLIN HFA) 108 (90 BASE) MCG/ACT inhaler Inhale 2 puffs into the lungs every 6 (six) hours as needed. For shortness of breath 6.7 g 11  . atorvastatin (LIPITOR) 20 MG tablet Take 1 tablet (20 mg total) by mouth once a week. 14 tablet 3  . docusate sodium (COLACE) 100 MG capsule Take 200 mg by mouth 2 (two) times daily.    . DULoxetine (CYMBALTA) 60 MG capsule Take 1 capsule (60 mg total) by mouth daily. 90 capsule 3  . gabapentin (NEURONTIN) 300 MG capsule Take 1 pill each morning, 1 in PM and 2 pills each evening 360 capsule 3  . predniSONE (DELTASONE) 10 MG tablet Take 3 tablets daily  for 3 days, 2 tablets for 3 days, 1 tablet for 3 days, 1/2 tablet for 3 days then stop 20 tablet 0   No current facility-administered medications for this visit.     Objective: BP 118/60 (BP Location: Right Arm, Patient Position: Sitting, Cuff Size: Large)   Pulse 83   Temp 98.7 F (37.1 C) (Oral)   Ht 6' (1.829 m)   Wt 229 lb 9.6 oz (104.1 kg)   BMI 31.14 kg/m  Gen: NAD, resting comfortably Lace like plaques on bilateral inner cheeks.  CV:  RRR no murmurs rubs or gallops Lungs: CTAB no crackles, wheeze, rhonchi Ext: no edema Skin: warm, dry  Assessment/Plan:  Lichen planus S:Patients white plaque and mouth discomfort started to improve on prednisone then symptoms worsened after about a week. He was on high dose 40mg .  A/P: Given lack of improvement/worsening while on steroids I have opted to taper him off prednisone gradually. His dentist had mentioned leukemia as possible cause and we did labs to evaluate- unfortunately patient has been on steroid and thus has affected WBC levels- will repeat in several weeks- 6. I told him my concern was along the lines of oral cancer- he agrees to follow up with oral surgeon for their opinion.  Patient Instructions  Taper off of prednisone  Would return to the oral surgeon to see if they have any other advice for long term care of this.   We may just have to deal with it but I really want you to see them     2 week repeat labs due to prednisone effects Orders Placed This Encounter  Procedures  . Comprehensive metabolic panel    [place]    Standing Status:   Future    Number of Occurrences:   1    Standing Expiration Date:   11/07/2017  . CBC with Differential/Platelet  Standing Status:   Future    Number of Occurrences:   1    Standing Expiration Date:   11/07/2017    Meds ordered this encounter  Medications  . predniSONE (DELTASONE) 10 MG tablet    Sig: Take 3 tablets daily  for 3 days, 2 tablets for 3 days, 1 tablet for 3 days, 1/2 tablet for 3 days then stop    Dispense:  20 tablet    Refill:  0   Return precautions advised.  Garret Reddish, MD

## 2016-11-07 NOTE — Patient Instructions (Signed)
Taper off of prednisone  Would return to the oral surgeon to see if they have any other advice for long term care of this.   We may just have to deal with it but I really want you to see them

## 2016-11-08 NOTE — Assessment & Plan Note (Addendum)
S:Patients white plaque and mouth discomfort started to improve on prednisone then symptoms worsened after about a week. He was on high dose 40mg .  A/P: Given lack of improvement/worsening while on steroids I have opted to taper him off prednisone gradually. His dentist had mentioned leukemia as possible cause and we did labs to evaluate- unfortunately patient has been on steroid and thus has affected WBC levels- will repeat in several weeks- 6. I told him my concern was along the lines of oral cancer- he agrees to follow up with oral surgeon for their opinion.  Patient Instructions  Taper off of prednisone  Would return to the oral surgeon to see if they have any other advice for long term care of this.   We may just have to deal with it but I really want you to see them

## 2016-11-11 ENCOUNTER — Other Ambulatory Visit: Payer: Self-pay

## 2016-11-11 DIAGNOSIS — R739 Hyperglycemia, unspecified: Secondary | ICD-10-CM

## 2016-11-11 DIAGNOSIS — D72829 Elevated white blood cell count, unspecified: Secondary | ICD-10-CM

## 2016-11-12 DIAGNOSIS — H35363 Drusen (degenerative) of macula, bilateral: Secondary | ICD-10-CM | POA: Diagnosis not present

## 2016-12-01 MED FILL — GABAPENTIN 300 MG CAPSULE: 300 | 90 days supply | Qty: 360 | Fill #1

## 2016-12-16 DIAGNOSIS — H5340 Unspecified visual field defects: Secondary | ICD-10-CM | POA: Diagnosis not present

## 2016-12-16 DIAGNOSIS — H35363 Drusen (degenerative) of macula, bilateral: Secondary | ICD-10-CM | POA: Diagnosis not present

## 2016-12-29 ENCOUNTER — Other Ambulatory Visit (INDEPENDENT_AMBULATORY_CARE_PROVIDER_SITE_OTHER): Payer: Medicare Other

## 2016-12-29 DIAGNOSIS — R739 Hyperglycemia, unspecified: Secondary | ICD-10-CM

## 2016-12-29 DIAGNOSIS — D72829 Elevated white blood cell count, unspecified: Secondary | ICD-10-CM

## 2016-12-30 DIAGNOSIS — H35363 Drusen (degenerative) of macula, bilateral: Secondary | ICD-10-CM | POA: Diagnosis not present

## 2016-12-30 LAB — BASIC METABOLIC PANEL
BUN: 10 mg/dL (ref 6–23)
CHLORIDE: 100 meq/L (ref 96–112)
CO2: 33 meq/L — AB (ref 19–32)
Calcium: 9.5 mg/dL (ref 8.4–10.5)
Creatinine, Ser: 0.82 mg/dL (ref 0.40–1.50)
GFR: 99.9 mL/min (ref >60.00)
Glucose, Bld: 89 mg/dL (ref 70–99)
Potassium: 4.2 mEq/L (ref 3.5–5.1)
SODIUM: 139 meq/L (ref 135–145)

## 2016-12-30 LAB — CBC WITH DIFFERENTIAL/PLATELET
BASOS ABS: 0.1 10*3/uL (ref 0.0–0.1)
BASOS PCT: 0.8 % (ref 0.0–3.0)
EOS ABS: 0.2 10*3/uL (ref 0.0–0.7)
Eosinophils Relative: 1.7 % (ref 0.0–5.0)
HEMATOCRIT: 43 % (ref 39.0–52.0)
Hemoglobin: 14 g/dL (ref 13.0–17.0)
LYMPHS PCT: 35.5 % (ref 12.0–46.0)
Lymphs Abs: 3.4 10*3/uL (ref 0.7–4.0)
MCHC: 32.6 g/dL (ref 30.0–36.0)
MCV: 92.4 fl (ref 78.0–100.0)
MONO ABS: 0.8 10*3/uL (ref 0.1–1.0)
Monocytes Relative: 7.9 % (ref 3.0–12.0)
Neutro Abs: 5.2 10*3/uL (ref 1.4–7.7)
Neutrophils Relative %: 54.1 % (ref 43.0–77.0)
Platelets: 209 10*3/uL (ref 150.0–400.0)
RBC: 4.65 Mil/uL (ref 4.22–5.81)
RDW: 13.5 % (ref 11.5–15.5)
WBC: 9.5 10*3/uL (ref 4.0–10.5)

## 2017-01-28 DIAGNOSIS — H2513 Age-related nuclear cataract, bilateral: Secondary | ICD-10-CM | POA: Diagnosis not present

## 2017-01-28 DIAGNOSIS — H353122 Nonexudative age-related macular degeneration, left eye, intermediate dry stage: Secondary | ICD-10-CM | POA: Diagnosis not present

## 2017-01-28 DIAGNOSIS — H353111 Nonexudative age-related macular degeneration, right eye, early dry stage: Secondary | ICD-10-CM | POA: Diagnosis not present

## 2017-02-20 ENCOUNTER — Telehealth: Payer: Self-pay | Admitting: Family Medicine

## 2017-02-20 NOTE — Telephone Encounter (Signed)
Wife states pt has been confirmed to have carpel tunnel by zach smith. He would like a referral to an orthopedic for surgery for surgery.  Wife states pt travel so ok to call her at home with any information.

## 2017-02-20 NOTE — Telephone Encounter (Signed)
Ok to refer? Would Zach not place referral?

## 2017-02-22 NOTE — Telephone Encounter (Signed)
Patient does have carpal tunnel. If he would like the opinion of hand surgery may place referral. Looked like next option Dr. Tamala Julian was looking at was EMG study before referral to surgery- Im ok with either the referral to hand surgery or having him back in with [facility]. Please confirm wife on [facility]

## 2017-02-27 ENCOUNTER — Other Ambulatory Visit: Payer: Self-pay

## 2017-02-27 DIAGNOSIS — G5600 Carpal tunnel syndrome, unspecified upper limb: Secondary | ICD-10-CM

## 2017-02-27 NOTE — Telephone Encounter (Signed)
Spoke to wife and referral placed to Hand Surgery

## 2017-03-11 MED FILL — DOXYCYCLINE HYCLATE 100 MG: 100 | 10 days supply | Qty: 20 | Fill #0

## 2017-03-27 DIAGNOSIS — G5601 Carpal tunnel syndrome, right upper limb: Secondary | ICD-10-CM | POA: Diagnosis not present

## 2017-03-27 DIAGNOSIS — G5602 Carpal tunnel syndrome, left upper limb: Secondary | ICD-10-CM | POA: Diagnosis not present

## 2017-04-01 MED FILL — HYDROXYCHLOROQUINE 200 MG T: 200 | 30 days supply | Qty: 60 | Fill #0

## 2017-04-10 MED FILL — GABAPENTIN 300 MG CAPSULE: 300 | 90 days supply | Qty: 360 | Fill #2

## 2017-04-20 DIAGNOSIS — G5601 Carpal tunnel syndrome, right upper limb: Secondary | ICD-10-CM | POA: Diagnosis not present

## 2017-04-20 MED FILL — HYDROCODON-APAP 5-325: 5-325 | 5 days supply | Qty: 10 | Fill #0

## 2017-05-04 DIAGNOSIS — G5601 Carpal tunnel syndrome, right upper limb: Secondary | ICD-10-CM | POA: Diagnosis not present

## 2017-05-16 DIAGNOSIS — Z23 Encounter for immunization: Secondary | ICD-10-CM | POA: Diagnosis not present

## 2017-06-30 ENCOUNTER — Telehealth: Payer: Self-pay | Admitting: Family Medicine

## 2017-06-30 ENCOUNTER — Ambulatory Visit: Payer: Medicare Other | Admitting: Family Medicine

## 2017-06-30 NOTE — Telephone Encounter (Signed)
Patient was scheduled today for a follow up appointment however, Dr. Yong Channel was out. Patient is requesting an earlier appointment than the Feb 27. Please advise on when he could be worked in.

## 2017-07-08 NOTE — Telephone Encounter (Signed)
[facility] Harrington, please schedule pt sooner can use a same day slot.

## 2017-07-08 NOTE — Telephone Encounter (Signed)
Patient rescheduled

## 2017-07-08 NOTE — Telephone Encounter (Signed)
Please reschedule 2/27 appointment for a sooner appointment. Okay to use same day slots.

## 2017-07-10 ENCOUNTER — Ambulatory Visit (INDEPENDENT_AMBULATORY_CARE_PROVIDER_SITE_OTHER): Payer: Medicare Other | Admitting: Family Medicine

## 2017-07-10 ENCOUNTER — Other Ambulatory Visit (HOSPITAL_COMMUNITY)
Admission: RE | Admit: 2017-07-10 | Discharge: 2017-07-10 | Disposition: A | Discharge status: Home / Self Care | Payer: Medicare Other | Source: Ambulatory Visit | Attending: Family Medicine | Admitting: Family Medicine

## 2017-07-10 ENCOUNTER — Encounter: Payer: Self-pay | Admitting: Family Medicine

## 2017-07-10 VITALS — BP 136/78 | HR 89 | Temp 98.4°F | Ht 72.0 in | Wt 236.2 lb

## 2017-07-10 DIAGNOSIS — Z113 Encounter for screening for infections with a predominantly sexual mode of transmission: Secondary | ICD-10-CM

## 2017-07-10 DIAGNOSIS — L439 Lichen planus, unspecified: Secondary | ICD-10-CM | POA: Diagnosis not present

## 2017-07-10 DIAGNOSIS — N489 Disorder of penis, unspecified: Secondary | ICD-10-CM | POA: Insufficient documentation

## 2017-07-10 DIAGNOSIS — G609 Hereditary and idiopathic neuropathy, unspecified: Secondary | ICD-10-CM | POA: Diagnosis not present

## 2017-07-10 DIAGNOSIS — Z118 Encounter for screening for other infectious and parasitic diseases: Secondary | ICD-10-CM | POA: Insufficient documentation

## 2017-07-10 DIAGNOSIS — Z7251 High risk heterosexual behavior: Secondary | ICD-10-CM | POA: Diagnosis not present

## 2017-07-10 DIAGNOSIS — Z114 Encounter for screening for human immunodeficiency virus [HIV]: Secondary | ICD-10-CM

## 2017-07-10 DIAGNOSIS — E785 Hyperlipidemia, unspecified: Secondary | ICD-10-CM | POA: Diagnosis not present

## 2017-07-10 DIAGNOSIS — R739 Hyperglycemia, unspecified: Secondary | ICD-10-CM | POA: Diagnosis not present

## 2017-07-10 MED ORDER — KETOCONAZOLE 2 % EX CREA: 1.0000 "application " | TOPICAL_CREAM | Freq: Every day | CUTANEOUS | 0 refills | Status: DC

## 2017-07-10 MED ORDER — GABAPENTIN 300 MG PO CAPS: ORAL_CAPSULE | ORAL | 1 refills | Status: DC

## 2017-07-10 MED FILL — KETOCONAZOLE 2% CREAM: 2 | 15 days supply | Qty: 60 | Fill #0

## 2017-07-10 MED FILL — GABAPENTIN 300 MG CAPSULE: 300 | 90 days supply | Qty: 540 | Fill #0

## 2017-07-10 NOTE — Patient Instructions (Addendum)
Please stop by lab before you go  Try 3 gabapentin twice a day  Use ketoconazole for 3 weeks on penis and itchy spot under scrotum- see [place] back if not improving  I would also like for you to sign up for an annual wellness visit with one of our nurses, Cassie or Manuela Schwartz, who both specialize in the annual wellness visit. This is a free benefit under medicare that may help [place] find additional ways to help you. Some highlights are reviewing medications, lifestyle, and doing a dementia screen.

## 2017-07-10 NOTE — Progress Notes (Signed)
Subjective:  Charles Ledo Sr. is a 67 y.o. year old very pleasant male patient who presents for/with See problem oriented charting ROS- no fever or chills. Does have penile lesions but no dysuria or polyuria. No chest pain or shortness of breath   Past Medical History-  Patient Active Problem List   Diagnosis Date Noted  . Carpal tunnel syndrome 02/14/2015    Priority: Medium  . Hereditary and idiopathic peripheral neuropathy 02/14/2015    Priority: Medium  . Hyperglycemia 02/14/2015    Priority: Medium  . Asthma     Priority: Medium  . Hyperlipidemia     Priority: Medium  . Hearing loss 02/14/2015    Priority: Low  . GERD (gastroesophageal reflux disease)     Priority: Low  . Chronic back pain     Priority: Low  . Lichen planus [id number]    Medications- reviewed and updated Current Outpatient Medications  Medication Sig Dispense Refill  . atorvastatin (LIPITOR) 20 MG tablet Take 1 tablet (20 mg total) by mouth once a week. 14 tablet 3  . DULoxetine (CYMBALTA) 60 MG capsule Take 1 capsule (60 mg total) by mouth daily. 90 capsule 3  . gabapentin (NEURONTIN) 300 MG capsule Take 1 pill each morning, 1 in PM and 2 pills each evening 360 capsule 3  . albuterol (PROVENTIL HFA;VENTOLIN HFA) 108 (90 BASE) MCG/ACT inhaler Inhale 2 puffs into the lungs every 6 (six) hours as needed. For shortness of breath (Patient not taking: Reported on 07/10/2017) 6.7 g 11   No current facility-administered medications for this visit.     Objective: BP 136/78 (BP Location: Left Arm, Patient Position: Sitting, Cuff Size: Large)   Pulse 89   Temp 98.4 F (36.9 C) (Oral)   Ht 6' (1.829 m)   Wt 236 lb 3.2 oz (107.1 kg)   SpO2 96%   BMI 32.03 kg/m  Gen: NAD, resting comfortably Whitish plaque in mouth much improved CV: RRR no murmurs rubs or gallops Lungs: CTAB no crackles, wheeze, rhonchi Ext: no edema Skin: warm, dry GU: 4 small circular lesions around base of glans- erythematous but  without fluid or discharge  Assessment/Plan: Penile lesion S: circumcised . Some red bumps on penis glans. He has only been active with his wife but is concerned about potential for STD. He has Cold sores 1-2 a year and is worried about herpes though there are no blisters and have been note- just erythematous flat lesions on glans.  A/P: will screen for STDs. Will use topical antifungal- advised follow up if not improved and STD testing normal.   Hyperlipidemia S: suspect controlled poorly on atorvastatin 20mg  once a week. No myalgias.  Lab Results  Component Value Date   CHOL 252 (H) 10/08/2015   HDL 39.80 10/08/2015   LDLCALC 172 (H) 10/08/2015   LDLDIRECT 165 (H) 07/10/2017   TRIG 200.0 (H) 10/08/2015   CHOLHDL 6 10/08/2015   A/P: LDL still at 165 today- advised every other day atorvastatin through mychart follow up- Roselyn Reef will send this in  Hereditary and idiopathic peripheral neuropathy S: Patient compliant with cymbalta 60mg  and gabapentin 300mg  in 2 in AM, 2 in PM. Pain worse with walking. Also has some numbness/tinging in hands along with some pain that carpal tunnel injections did not help A/P: to help with peripheral neuropathy- may continue both- will go to gabapentin 900 mg BID as he requests strong dose of medicine. dont see prior b12 so that would be worth obtaining in  future (noted after visit)- -patient has pain particularly with longer walks- parking placard written for  Lichen planus Plaques on side of mouth thought to be lichen planus. He mainly follows with his dentist after steroid trial here did not help him. Apparently used an antibiotic that dissolves in mouth and helps him   Hyperglycemia Luckily a1c stable from 2015.  Lab Results  Component Value Date   HGBA1C 5.8 (H) 07/10/2017  Encouraged need for healthy eating, regular exercise, weight loss.    Return in about 6 months (around 01/07/2018) for follow up- or sooner if needed.  Lab/Order  associations: Unprotected sex - Plan: HIV antibody, RPR, Urine cytology ancillary only, HSV(herpes simplex vrs) 1+2 ab-IgG, HSV 1/2 Ab (IgM), IFA w/rflx Titer, HSV(herpes simplex vrs) 1+2 ab-IgG, Urine cytology ancillary only, RPR, HIV antibody, CANCELED: HSV(herpes simplex vrs) 1+2 ab-IgM, CANCELED: HSV(herpes simplex vrs) 1+2 ab-IgM  Screening for HIV (human immunodeficiency virus) - Plan: HIV antibody, HIV antibody  Screening examination for venereal disease - Plan: RPR, HSV 1/2 Ab (IgM), IFA w/rflx Titer, RPR  Screening for gonorrhea - Plan: Urine cytology ancillary only, Urine cytology ancillary only  Screening for chlamydial disease - Plan: Urine cytology ancillary only, Urine cytology ancillary only  Penile lesion - Plan: HSV(herpes simplex vrs) 1+2 ab-IgG, HSV 1/2 Ab (IgM), IFA w/rflx Titer, HSV(herpes simplex vrs) 1+2 ab-IgG, CANCELED: HSV(herpes simplex vrs) 1+2 ab-IgM, CANCELED: HSV(herpes simplex vrs) 1+2 ab-IgM  Hyperglycemia - Plan: Hemoglobin A1c, Hemoglobin A1c, CANCELED: Hemoglobin A1c  Hyperlipidemia, unspecified hyperlipidemia type - Plan: LDL cholesterol, direct, LDL cholesterol, direct, CANCELED: LDL cholesterol, direct  Meds ordered this encounter  Medications  . ketoconazole (NIZORAL) 2 % cream    Sig: Apply 1 application topically daily. For up to 3 weeks. See [place] back if not improved    Dispense:  60 g    Refill:  0  . gabapentin (NEURONTIN) 300 MG capsule    Sig: Take 3 pills twice a day    Dispense:  540 capsule    Refill:  1    Return precautions advised.  Garret Reddish, MD

## 2017-07-11 NOTE — Assessment & Plan Note (Signed)
Luckily a1c stable from 2015.  Lab Results  Component Value Date   HGBA1C 5.8 (H) 07/10/2017  Encouraged need for healthy eating, regular exercise, weight loss.

## 2017-07-11 NOTE — Assessment & Plan Note (Signed)
S: suspect controlled poorly on atorvastatin 20mg  once a week. No myalgias.  Lab Results  Component Value Date   CHOL 252 (H) 10/08/2015   HDL 39.80 10/08/2015   LDLCALC 172 (H) 10/08/2015   LDLDIRECT 165 (H) 07/10/2017   TRIG 200.0 (H) 10/08/2015   CHOLHDL 6 10/08/2015   A/P: LDL still at 165 today- advised every other day atorvastatin through mychart follow up- Roselyn Reef will send this in

## 2017-07-11 NOTE — Assessment & Plan Note (Addendum)
S: Patient compliant with cymbalta 60mg  and gabapentin 300mg  in 2 in AM, 2 in PM. Pain worse with walking. Also has some numbness/tinging in hands along with some pain that carpal tunnel injections did not help A/P: to help with peripheral neuropathy- may continue both- will go to gabapentin 900 mg BID as he requests strong dose of medicine. dont see prior b12 so that would be worth obtaining in future (noted after visit)- -patient has pain particularly with longer walks- parking placard written for

## 2017-07-11 NOTE — Assessment & Plan Note (Signed)
Plaques on side of mouth thought to be lichen planus. He mainly follows with his dentist after steroid trial here did not help him. Apparently used an antibiotic that dissolves in mouth and helps him

## 2017-07-13 ENCOUNTER — Other Ambulatory Visit: Payer: Self-pay

## 2017-07-13 LAB — HSV 1/2 AB (IGM), IFA W/RFLX TITER
HSV 1 IGM SCREEN: NEGATIVE
HSV 2 IgM Screen: NEGATIVE

## 2017-07-13 LAB — HSV(HERPES SIMPLEX VRS) I + II AB-IGG
HAV 1 IGG,TYPE SPECIFIC AB: >58 index — ABNORMAL HIGH
HSV 2 IGG,TYPE SPECIFIC AB: <0.9 index

## 2017-07-13 LAB — LDL CHOLESTEROL, DIRECT: Direct LDL: 165 mg/dL — ABNORMAL HIGH (ref <100)

## 2017-07-13 LAB — HEMOGLOBIN A1C
HEMOGLOBIN A1C: 5.8 %{Hb} — AB (ref <5.7)
Mean Plasma Glucose: 120 (calc)
eAG (mmol/L): 6.6 (calc)

## 2017-07-13 LAB — HIV ANTIBODY (ROUTINE TESTING W REFLEX): HIV 1&2 Ab, 4th Generation: NONREACTIVE

## 2017-07-13 LAB — RPR: RPR Ser Ql: NONREACTIVE

## 2017-07-13 MED ORDER — ATORVASTATIN CALCIUM 20 MG PO TABS: 20.0000 mg | ORAL_TABLET | ORAL | 3 refills | Status: DC

## 2017-07-13 MED FILL — ATORVASTATIN 20 MG TABLET: 20 | 30 days supply | Qty: 15 | Fill #0

## 2017-07-14 LAB — URINE CYTOLOGY ANCILLARY ONLY
CHLAMYDIA, DNA PROBE: NEGATIVE
NEISSERIA GONORRHEA: NEGATIVE
TRICH (WINDOWPATH): NEGATIVE

## 2017-07-17 MED FILL — DEXAMETHASONE 0.5 MG/5 ML L: 0.5 | 25 days supply | Qty: 480 | Fill #0

## 2017-07-29 ENCOUNTER — Ambulatory Visit: Payer: Medicare Other | Admitting: Family Medicine

## 2017-08-24 ENCOUNTER — Other Ambulatory Visit: Payer: Self-pay | Admitting: Family Medicine

## 2017-09-15 MED FILL — FLUOCINONIDE 0.05% GEL: 0.05 | 15 days supply | Qty: 15 | Fill #0

## 2017-10-19 MED FILL — GABAPENTIN 300 MG CAPSULE: 300 | 90 days supply | Qty: 540 | Fill #1

## 2017-10-22 ENCOUNTER — Encounter: Payer: Self-pay | Admitting: Family Medicine

## 2017-10-22 ENCOUNTER — Ambulatory Visit (INDEPENDENT_AMBULATORY_CARE_PROVIDER_SITE_OTHER): Payer: Medicare Other | Admitting: Family Medicine

## 2017-10-22 VITALS — BP 120/64 | HR 77 | Temp 98.6°F | Ht 72.0 in | Wt 217.6 lb

## 2017-10-22 DIAGNOSIS — Z23 Encounter for immunization: Secondary | ICD-10-CM | POA: Diagnosis not present

## 2017-10-22 DIAGNOSIS — M79662 Pain in left lower leg: Secondary | ICD-10-CM

## 2017-10-22 DIAGNOSIS — Z1211 Encounter for screening for malignant neoplasm of colon: Secondary | ICD-10-CM | POA: Diagnosis not present

## 2017-10-22 DIAGNOSIS — Z1159 Encounter for screening for other viral diseases: Secondary | ICD-10-CM | POA: Diagnosis not present

## 2017-10-22 NOTE — Progress Notes (Signed)
Subjective:  Charles Harrington. is a 67 y.o. year old very pleasant male patient who presents for/with See problem oriented charting ROS-admits to left calf pain.  Denies new edema.  Denies chest pain or shortness of breath.  Continued pain in bottom of both feet with known neuropathy  Past Medical History-  Patient Active Problem List   Diagnosis Date Noted  . Carpal tunnel syndrome 02/14/2015    Priority: Medium  . Hereditary and idiopathic peripheral neuropathy 02/14/2015    Priority: Medium  . Hyperglycemia 02/14/2015    Priority: Medium  . Asthma     Priority: Medium  . Hyperlipidemia     Priority: Medium  . Hearing loss 02/14/2015    Priority: Low  . GERD (gastroesophageal reflux disease)     Priority: Low  . Chronic back pain     Priority: Low  . Lichen planus [id number]    Medications- reviewed and updated Current Outpatient Medications  Medication Sig Dispense Refill  . albuterol (PROVENTIL HFA;VENTOLIN HFA) 108 (90 BASE) MCG/ACT inhaler Inhale 2 puffs into the lungs every 6 (six) hours as needed. For shortness of breath 6.7 g 11  . atorvastatin (LIPITOR) 20 MG tablet TAKE 1 TABLET ONCE A WEEK. 13 tablet 3  . DULoxetine (CYMBALTA) 60 MG capsule TAKE 1 CAPSULE EVERY DAY 90 capsule 1  . gabapentin (NEURONTIN) 300 MG capsule Take 3 pills twice a day 540 capsule 1  . ketoconazole (NIZORAL) 2 % cream Apply 1 application topically daily. For up to 3 weeks. See [place] back if not improved 60 g 0   No current facility-administered medications for this visit.     Objective: BP 120/64 (BP Location: Left Arm, Patient Position: Sitting, Cuff Size: Large)   Pulse 77   Temp 98.6 F (37 C) (Oral)   Ht 6' (1.829 m)   Wt 217 lb 9.6 oz (98.7 kg)   SpO2 95%   BMI 29.51 kg/m  Gen: NAD, resting comfortably CV: RRR no murmurs rubs or gallops Lungs: CTAB no crackles, wheeze, rhonchi Abdomen: soft/nontender/nondistended/normal bowel sounds  Ext: no edema bilaterally-some varicose  veins on the left leg but none noted on the right.  Per patient and wife this has been a chronic issue since an injury years ago. Skin: warm, dry, no rash over leg Normal thompson squeeze test. Pain with deep palpation of left calf in upper portion. Able to stand on left toes without significant pain  Assessment/Plan:  Pain of left calf - Plan: D-Dimer, Quantitative S: patient with left calf pain for at least 2 to 3 weeks.  Seems to be worse with walking.  Pain can be mild-10 with walking can get up to moderate level.  Worsened by pushing on the area.  Denies fever or chills.  Denies new edema in the leg.  He admits to some varicose veins only in the left leg after injury years ago to the left leg. A/P: This could simply be a calf strain- I did give him a sports medicine advisor handout and advised exercises 3 times a week for a month.  On the other hand before he starts this we need to rule out DVT-we will get d-dimer.  If d-dimer is positive-we will try to get ultrasound though may be tough before the weekend and may need to consider medication like Xarelto or Eliquis over the long holiday weekend.  Could also use short-term NSAIDs for the strain or refer to Dr. Paulla Fore if needed.  Lab/Order associations: Pain  of left calf - Plan: D-Dimer, Quantitative  Need for prophylactic vaccination against Streptococcus pneumoniae (pneumococcus) - Plan: Pneumococcal conjugate vaccine 13-valent IM  Encounter for HCV screening test for low risk patient - Plan: Hepatitis C antibody  Colon cancer screening - Plan: Cologuard  Return precautions advised.  Garret Reddish, MD

## 2017-10-22 NOTE — Patient Instructions (Addendum)
Health Maintenance Due  Topic Date Due  . Hepatitis C Screening -today [birth year]  . COLONOSCOPY -Cologuard- our team will set you up for that 12/14/2000  . PNA vac Low Risk Adult (1 of 2 - PCV13)- given first pneumonia shot today- pneumovax 23 due next year 12/15/2015   Please stop by lab before you go  Our team will give you a handout for calf strain. If the d dimer is negative we will start those exercises 3x a week - and I may call in a medicine for inflammation.   If d dimer is positive- we will need to do a scan of the leg hopefully this week to make sure no blood clot

## 2017-10-22 NOTE — Progress Notes (Deleted)
cologuard

## 2017-10-23 LAB — HEPATITIS C ANTIBODY
HEP C AB: NONREACTIVE
SIGNAL TO CUT-OFF: 0.03 (ref <1.00)

## 2017-10-23 LAB — D-DIMER, QUANTITATIVE: D-Dimer, Quant: 0.41 mcg/mL FEU (ref <0.50)

## 2017-10-23 NOTE — Progress Notes (Signed)
D dimer is not elevated which means no blood clot so this likely is a calf strain. Try aleve twice a day for 7 days. Start the exercises I gave you in 3 days. If not improved- we can have you see Dr. Paulla Fore

## 2017-11-11 ENCOUNTER — Telehealth: Payer: Self-pay

## 2017-11-11 NOTE — Telephone Encounter (Signed)
-----   Message from Marin Olp, MD sent at 11/05/2017  8:06 AM EDT ----- Lets encourage him to get cologuard done.   Charles Harrington  ----- Message ----- From: SYSTEM Sent: 10/27/2017  12:05 AM To: Marin Olp, MD

## 2017-11-11 NOTE — Telephone Encounter (Signed)
Called patient and spoke with patient who is currently out of town. He states he will try to get the kit completed this weekend and get it sent back in.

## 2017-11-12 DIAGNOSIS — Z1211 Encounter for screening for malignant neoplasm of colon: Secondary | ICD-10-CM | POA: Diagnosis not present

## 2017-11-12 LAB — COLOGUARD: Cologuard: NEGATIVE

## 2017-11-23 ENCOUNTER — Encounter: Payer: Self-pay | Admitting: Family Medicine

## 2017-12-29 ENCOUNTER — Other Ambulatory Visit: Payer: Self-pay | Admitting: Family Medicine

## 2018-01-13 ENCOUNTER — Other Ambulatory Visit: Payer: Self-pay | Admitting: Family Medicine

## 2018-01-19 MED FILL — GABAPENTIN 300 MG CAPSULE: 300 | 90 days supply | Qty: 540 | Fill #0

## 2018-01-29 ENCOUNTER — Ambulatory Visit (INDEPENDENT_AMBULATORY_CARE_PROVIDER_SITE_OTHER): Payer: Medicare Other

## 2018-01-29 VITALS — BP 120/68 | HR 75 | Ht 72.0 in | Wt 217.8 lb

## 2018-01-29 DIAGNOSIS — Z0000 Encounter for general adult medical examination without abnormal findings: Secondary | ICD-10-CM | POA: Diagnosis not present

## 2018-01-29 NOTE — Patient Instructions (Signed)
Charles Harrington , Thank you for taking time to come for your Medicare Wellness Visit. I appreciate your ongoing commitment to your health goals. Please review the following plan we discussed and let me know if I can assist you in the future.   These are the goals we discussed: Goals    . DIET - INCREASE WATER INTAKE     Drink more water, less Gatorade       This is a list of the screening recommended for you and due dates:  Health Maintenance  Topic Date Due  . Flu Shot  12/31/2017  . Pneumonia vaccines (2 of 2 - PPSV23) 10/23/2018  . Cologuard (Stool DNA test)  11/12/2020  . Tetanus Vaccine  10/14/2025  .  Hepatitis C: One time screening is recommended by Center for Disease Control  (CDC) for  adults born from [birth year] through 1965.   Completed   Preventive Care for Adults  A healthy lifestyle and preventive care can promote health and wellness. Preventive health guidelines for adults include the following key practices.  . A routine yearly physical is a good way to check with your health care provider about your health and preventive screening. It is a chance to share any concerns and updates on your health and to receive a thorough exam.  . Visit your dentist for a routine exam and preventive care every 6 months. Brush your teeth twice a day and floss once a day. Good oral hygiene prevents tooth decay and gum disease.  . The frequency of eye exams is based on your age, health, family medical history, use  of contact lenses, and other factors. Follow your health care provider's recommendations for frequency of eye exams.  . Eat a healthy diet. Foods like vegetables, fruits, whole grains, low-fat dairy products, and lean protein foods contain the nutrients you need without too many calories. Decrease your intake of foods high in solid fats, added sugars, and salt. Eat the right amount of calories for you. Get information about a proper diet from your health care provider, if necessary.  .  Regular physical exercise is one of the most important things you can do for your health. Most adults should get at least 150 minutes of moderate-intensity exercise (any activity that increases your heart rate and causes you to sweat) each week. In addition, most adults need muscle-strengthening exercises on 2 or more days a week.  Silver Sneakers may be a benefit available to you. To determine eligibility, you may visit the website: www.silversneakers.com or contact program at 769-019-5373 Mon-Fri between 8AM-8PM.   . Maintain a healthy weight. The body mass index (BMI) is a screening tool to identify possible weight problems. It provides an estimate of body fat based on height and weight. Your health care provider can find your BMI and can help you achieve or maintain a healthy weight.   For adults 20 years and older: ? A BMI below 18.5 is considered underweight. ? A BMI of 18.5 to 24.9 is normal. ? A BMI of 25 to 29.9 is considered overweight. ? A BMI of 30 and above is considered obese.   . Maintain normal blood lipids and cholesterol levels by exercising and minimizing your intake of saturated fat. Eat a balanced diet with plenty of fruit and vegetables. Blood tests for lipids and cholesterol should begin at age 67 and be repeated every 5 years. If your lipid or cholesterol levels are high, you are over 50, or you are at high  risk for heart disease, you may need your cholesterol levels checked more frequently. Ongoing high lipid and cholesterol levels should be treated with medicines if diet and exercise are not working.  . If you smoke, find out from your health care provider how to quit. If you do not use tobacco, please do not start.  . If you choose to drink alcohol, please do not consume more than 2 drinks per day. One drink is considered to be 12 ounces (355 mL) of beer, 5 ounces (148 mL) of wine, or 1.5 ounces (44 mL) of liquor.  . If you are 16-43 years old, ask your health care  provider if you should take aspirin to prevent strokes.  . Use sunscreen. Apply sunscreen liberally and repeatedly throughout the day. You should seek shade when your shadow is shorter than you. Protect yourself by wearing long sleeves, pants, a wide-brimmed hat, and sunglasses year round, whenever you are outdoors.  . Once a month, do a whole body skin exam, using a mirror to look at the skin on your back. Tell your health care provider of new moles, moles that have irregular borders, moles that are larger than a pencil eraser, or moles that have changed in shape or color.

## 2018-01-29 NOTE — Progress Notes (Signed)
Subjective:   Charles Vangieson Sr. is a 67 y.o. male who presents for an Initial Medicare Annual Wellness Visit.  Review of Systems  No ROS.  Medicare Wellness Visit. Additional risk factors are reflected in the social history. Cardiac Risk Factors include: advanced age (>48men, >14 women);male gender Patient lives with wife in a single story home with their dog Diamond. One son lives near by. Enjoys deer hunting, working on cars, Careers adviser.  Patient goes to bed around 10:30. Patient gets up about once a night to go to the bathroom. Gets up around 8:00am. Sometimes feels rested when he wakes up. Does not wear a CPAP.   Objective:    Today's Vitals   01/29/18 1633  BP: 120/68  Pulse: 75  SpO2: 94%  Weight: 217 lb 12.8 oz (98.8 kg)  Height: 6' (1.829 m)   Body mass index is 29.54 kg/m.  Advanced Directives 01/29/2018 08/27/2011 08/26/2011  Does Patient Have a Medical Advance Directive? Yes Patient has advance directive, copy not in chart Patient has advance directive, copy not in chart  Type of Advance Directive Living will;Healthcare Power of [place];Living will [place];Living will  Does patient want to make changes to medical advance directive? No - Patient declined - -  Copy of [place] in Chart? No - copy requested Copy requested from family Copy requested from family  Pre-existing out of facility DNR order (yellow form or pink MOST form) - No -    Current Medications (verified) Outpatient Encounter Medications as of 01/29/2018  Medication Sig  . albuterol (PROVENTIL HFA;VENTOLIN HFA) 108 (90 BASE) MCG/ACT inhaler Inhale 2 puffs into the lungs every 6 (six) hours as needed. For shortness of breath  . DULoxetine (CYMBALTA) 60 MG capsule TAKE 1 CAPSULE EVERY DAY  . gabapentin (NEURONTIN) 300 MG capsule TAKE 3 CAPSULES BY MOUTH TWICE DAILY  . ketoconazole (NIZORAL) 2 % cream Apply 1 application  topically daily. For up to 3 weeks. See [place] back if not improved  . [DISCONTINUED] atorvastatin (LIPITOR) 20 MG tablet TAKE 1 TABLET ONCE A WEEK.   No facility-administered encounter medications on file as of 01/29/2018.     Allergies (verified) Codeine   History: Past Medical History:  Diagnosis Date  . Asthma    albuterol once every other week, likely cat allergy  . Chronic back pain    mild after back surgery L4 and L5 ruptured disc with surgery 2013  . GERD (gastroesophageal reflux disease)    omeprazole prn  . Hyperlipidemia    no rx  . Neuropathy    gabapentin 300mg  BID   Past Surgical History:  Procedure Laterality Date  . CARPAL TUNNEL RELEASE Right   . Colonscopy    . LUMBAR LAMINECTOMY/DECOMPRESSION MICRODISCECTOMY  08/27/2011   Procedure: LUMBAR LAMINECTOMY/DECOMPRESSION MICRODISCECTOMY 2 LEVELS;  Surgeon: Floyce Stakes, MD;  Location: [facility];  Service: Neurosurgery;  Laterality: Left;  Left Lumbar four- five, five-sacral one Diskectomy   Family History  Problem Relation Age of Onset  . Hypercholesterolemia Mother        father, sisters, brothers  . Stroke Mother        late 32s, early 12s  . Diabetes Mother   . Congestive Heart Failure Mother   . Heart disease Sister        CABG sister 56- heavy smoker   . Hypercholesterolemia Sister   . Hypercholesterolemia Father   . Hypercholesterolemia Brother   .  Hypercholesterolemia Sister   . Hypercholesterolemia Sister   . Hypercholesterolemia Sister   . Early death Sister   . Hydrocephalus Sister   . Hypercholesterolemia Sister   . Hypercholesterolemia Brother   . Pancreatic cancer Brother   . Cancer Brother   . Hypercholesterolemia Brother   . Hypercholesterolemia Brother   . Anesthesia problems Neg Hx    Social History   Socioeconomic History  . Marital status: Married    Spouse name: Not on file  . Number of children: Not on file  . Years of education: Not on file  . Highest education level:  Not on file  Occupational History  . Not on file  Social Needs  . Financial resource strain: Not on file  . Food insecurity:    Worry: Not on file    Inability: Not on file  . Transportation needs:    Medical: Not on file    Non-medical: Not on file  Tobacco Use  . Smoking status: Never Smoker  . Smokeless tobacco: Never Used  Substance and Sexual Activity  . Alcohol use: Yes    Alcohol/week: 0.0 standard drinks    Comment: rarely 2 beers a year  . Drug use: No  . Sexual activity: Not on file  Lifestyle  . Physical activity:    Days per week: Not on file    Minutes per session: Not on file  . Stress: Not on file  Relationships  . Social connections:    Talks on phone: Not on file    Gets together: Not on file    Attends religious service: Not on file    Active member of club or organization: Not on file    Attends meetings of clubs or organizations: Not on file    Relationship status: Not on file  Other Topics Concern  . Not on file  Social History Narrative   Family: Married 16 years in 2016. No children. 1 dog- pit bull and lab mix. Lives wife.    2nd youngest of 68      Work: Magazine features editor for [employer]   HS degree      Hobbies: rest, deer and rabbit hunt   Tobacco Counseling Counseling given: Not Answered   Activities of Daily Living In your present state of health, do you have any difficulty performing the following activities: 01/29/2018  Hearing? Y  Comment Discussed hearing test at [place]? N  Difficulty concentrating or making decisions? N  Walking or climbing stairs? Y  Comment Knee bothers him occasionally  Dressing or bathing? N  Doing errands, shopping? N  Preparing Food and eating ? N  Using the Toilet? N  In the past six months, have you accidently leaked urine? N  Do you have problems with loss of bowel control? N  Managing your Medications? N  Managing your Finances? N  Housekeeping or managing your Housekeeping? N    Some recent data might be hidden     Immunizations and Health Maintenance Immunization History  Administered Date(s) Administered  . Influenza,inj,Quad PF,6+ Mos 02/14/2015, 04/11/2016  . Influenza-Unspecified 05/04/2017  . Pneumococcal Conjugate-13 10/22/2017  . Tdap 10/15/2015   Health Maintenance Due  Topic Date Due  . INFLUENZA VACCINE  12/31/2017    Patient Care Team: Marin Olp, MD as PCP - General (Family Medicine)  Indicate any recent Medical Services you may have received from other than [facility] providers in the past year (date may be approximate).  Assessment:   This is a routine wellness examination for Charles Harrington.  Hearing/Vision screen  Hearing Screening   Method: Audiometry   125Hz  250Hz  500Hz  1000Hz  2000Hz  3000Hz  4000Hz  6000Hz  8000Hz   Right ear:           Left ear:           Comments: Recommendation for referral both ears Discussed going to [facility] to have hearing test performed    Visual Acuity Screening   Right eye Left eye Both eyes  Without correction: 20/25 20/30 20/30   With correction:     Comments: Has glasses that he wears [facility]   Dietary issues and exercise activities discussed: Current Exercise Habits: The patient does not participate in regular exercise at present, Exercise limited by: None identified  Breakfast: 3 cups of coffee and an apple pie  Lunch: sandwich, sweet tea or orange juice  Dinner: Chicken noodle soup, salad, and sweet tea Goals    . DIET - INCREASE WATER INTAKE     Drink more water, less Gatorade      Depression Screen PHQ 2/9 Scores 01/29/2018 10/23/2016 10/15/2015  PHQ - 2 Score 0 0 0    Fall Risk Fall Risk  01/29/2018 10/23/2016 10/15/2015  Falls in the past year? No No No      Cognitive Function:     6CIT Screen 01/29/2018  What Year? 0 points  What month? 0 points  What time? 0 points  Count back from 20 0 points  Months in reverse 0 points    Screening Tests Health Maintenance   Topic Date Due  . INFLUENZA VACCINE  12/31/2017  . PNA vac Low Risk Adult (2 of 2 - PPSV23) 10/23/2018  . Fecal DNA (Cologuard)  11/12/2020  . TETANUS/TDAP  10/14/2025  . Hepatitis C Screening  Completed         Plan:    Follow Up with PCP as Needed  I have personally reviewed and noted the following in the patient's chart:   . Medical and social history . Use of alcohol, tobacco or illicit drugs  . Current medications and supplements . Functional ability and status . Nutritional status . Physical activity . Advanced directives . List of other physicians . Vitals . Screenings to include cognitive, depression, and falls . Referrals and appointments  In addition, I have reviewed and discussed with patient certain preventive protocols, quality metrics, and best practice recommendations. A written personalized care plan for preventive services as well as general preventive health recommendations were provided to patient.     [place], [place]   06/22/9756

## 2018-01-29 NOTE — Progress Notes (Signed)
PCP notes:Last OV 10/22/17: Left calf pain   Health maintenance: Flu Shot this Fall   Abnormal screenings: None   Patient concerns: Foot pain radiating up to knees. Relieved by wiggling lower feet and legs.   Nurse concerns: None   Next PCP appt: Schedule as Needed

## 2018-01-30 NOTE — Progress Notes (Signed)
I have reviewed and agree with note, evaluation, plan. Happy to see patient sooner if he wants to review foot pain issues.   Garret Reddish, MD

## 2018-04-06 DIAGNOSIS — Z23 Encounter for immunization: Secondary | ICD-10-CM | POA: Diagnosis not present

## 2018-04-19 MED FILL — GABAPENTIN 300 MG CAPSULE: 300 | 90 days supply | Qty: 540 | Fill #1

## 2018-05-05 ENCOUNTER — Telehealth: Payer: Self-pay

## 2018-05-05 NOTE — Telephone Encounter (Signed)
noted 

## 2018-05-05 NOTE — Telephone Encounter (Signed)
Patient walked into the clinic today asking to schedule an appointment.  Stated he had "stomach problems".  Triage was carried out.  Per patient, he began having stomach pain in the RUQ 1 month ago.  Also has some pain the lower abdomen.  Comes and goes.  No nausea, vomiting, diarrhea.  No chest pain, SOB.  Some bloating, heartburn.  Nothing provides relief.  Patient states he "feels normal".  Vitals stable.  BP:  134/82, HR:  96, Temp:  98.7, O2:  95%.  Final disposition:  Appointment scheduled on 05/06/2018 @ 4:15 pm with Dr. Yong Channel.  Dr. Yong Channel in agreement with this plan.

## 2018-05-06 ENCOUNTER — Ambulatory Visit (INDEPENDENT_AMBULATORY_CARE_PROVIDER_SITE_OTHER): Payer: Medicare Other | Admitting: Family Medicine

## 2018-05-06 ENCOUNTER — Encounter: Payer: Self-pay | Admitting: Family Medicine

## 2018-05-06 ENCOUNTER — Ambulatory Visit: Payer: Medicare Other | Admitting: Family Medicine

## 2018-05-06 VITALS — BP 136/80 | HR 80 | Temp 98.0°F | Ht 72.0 in | Wt 221.4 lb

## 2018-05-06 DIAGNOSIS — R109 Unspecified abdominal pain: Secondary | ICD-10-CM | POA: Diagnosis not present

## 2018-05-06 DIAGNOSIS — R682 Dry mouth, unspecified: Secondary | ICD-10-CM | POA: Diagnosis not present

## 2018-05-06 DIAGNOSIS — K5909 Other constipation: Secondary | ICD-10-CM | POA: Diagnosis not present

## 2018-05-06 LAB — TSH: TSH: 1.4 u[IU]/mL (ref 0.35–4.50)

## 2018-05-06 LAB — COMPREHENSIVE METABOLIC PANEL
ALT: 19 U/L (ref 0–53)
AST: 15 U/L (ref 0–37)
Albumin: 4.5 g/dL (ref 3.5–5.2)
Alkaline Phosphatase: 96 U/L (ref 39–117)
BILIRUBIN TOTAL: 0.4 mg/dL (ref 0.2–1.2)
BUN: 12 mg/dL (ref 6–23)
CO2: 30 meq/L (ref 19–32)
CREATININE: 0.89 mg/dL (ref 0.40–1.50)
Calcium: 10 mg/dL (ref 8.4–10.5)
Chloride: 101 mEq/L (ref 96–112)
GFR: 90.51 mL/min (ref >60.00)
Glucose, Bld: 103 mg/dL — ABNORMAL HIGH (ref 70–99)
Potassium: 4.4 mEq/L (ref 3.5–5.1)
SODIUM: 137 meq/L (ref 135–145)
Total Protein: 7.6 g/dL (ref 6.0–8.3)

## 2018-05-06 LAB — CBC WITH DIFFERENTIAL/PLATELET
BASOS ABS: 0 10*3/uL (ref 0.0–0.1)
BASOS PCT: 0.3 % (ref 0.0–3.0)
EOS ABS: 0.1 10*3/uL (ref 0.0–0.7)
Eosinophils Relative: 1.1 % (ref 0.0–5.0)
HEMATOCRIT: 44 % (ref 39.0–52.0)
Hemoglobin: 14.7 g/dL (ref 13.0–17.0)
Lymphocytes Relative: 32.4 % (ref 12.0–46.0)
Lymphs Abs: 3.2 10*3/uL (ref 0.7–4.0)
MCHC: 33.4 g/dL (ref 30.0–36.0)
MCV: 90.7 fl (ref 78.0–100.0)
MONO ABS: 0.8 10*3/uL (ref 0.1–1.0)
Monocytes Relative: 7.7 % (ref 3.0–12.0)
NEUTROS ABS: 5.8 10*3/uL (ref 1.4–7.7)
NEUTROS PCT: 58.5 % (ref 43.0–77.0)
PLATELETS: 241 10*3/uL (ref 150.0–400.0)
RBC: 4.86 Mil/uL (ref 4.22–5.81)
RDW: 13.2 % (ref 11.5–15.5)
WBC: 9.8 10*3/uL (ref 4.0–10.5)

## 2018-05-06 NOTE — Progress Notes (Signed)
Patient: Charles Bjorkman Sr. MRN: [id number] DOB: 13-Dec-1950 PCP: Marin Olp, MD     Subjective:  Chief Complaint  Patient presents with  . stomach discomfort    x 2 wks    HPI: The patient is a 67 y.o. male who presents today for stomach discomfort on and off x 2 wks. He states pain comes and goes and is located in his right lateral side. He states it's not pain it's more discomfort. Sometimes it will travel across his stomach and hurt in his LLQ. He rates the pain as 3/10 and is aggravating. It puts pressure on his stomach. He goes to sleep really easily so doesn't think it bothers him at night. He cant recall any heavy lifting or precipitating event. He has had more constipation over the last 3-4 weeks. He is taking stool softener. No nausea or vomiting. He doesn't feel like this pain is related to food/eating. No blood in stool.   Dry mouth: states it has gotten progressively worse. This concerns him more than his side. He states he can hardly go outside and has to always have a candy in his mouth. He denies any dry eyes, does have dry skin.   Review of Systems  Constitutional: Negative for chills, fatigue and fever.  HENT:       Dry mouth   Eyes:       Denies dry eyes  Respiratory: Negative for shortness of breath.   Cardiovascular: Negative for chest pain.  Gastrointestinal: Positive for abdominal pain and constipation. Negative for blood in stool, diarrhea, nausea and vomiting.  Genitourinary: Negative for dysuria, flank pain and hematuria.  Skin: Negative for rash.       Dry skin     Allergies Patient is allergic to codeine.  Past Medical History Patient  has a past medical history of Asthma, Chronic back pain, GERD (gastroesophageal reflux disease), Hyperlipidemia, and Neuropathy.  Surgical History Patient  has a past surgical history that includes Colonscopy; Lumbar laminectomy/decompression microdiscectomy (08/27/2011); and Carpal tunnel release  (Right).  Family History Pateint's family history includes Cancer in his brother; Congestive Heart Failure in his mother; Diabetes in his mother; Early death in his sister; Heart disease in his sister; Hydrocephalus in his sister; Hypercholesterolemia in his brother, brother, brother, brother, father, mother, sister, sister, sister, sister, and sister; Pancreatic cancer in his brother; Stroke in his mother.  Social History Patient  reports that he has never smoked. He has never used smokeless tobacco. He reports that he drinks alcohol. He reports that he does not use drugs.    Objective: Vitals:   05/06/18 1333  BP: 136/80  Pulse: 80  Temp: 98 F (36.7 C)  TempSrc: Oral  SpO2: 98%  Weight: 221 lb 6.4 oz (100.4 kg)  Height: 6' (1.829 m)    Body mass index is 30.03 kg/m.  Physical Exam  Constitutional: He appears well-developed and well-nourished.  HENT:  Right Ear: External ear normal.  Left Ear: External ear normal.  Mouth/Throat: Oropharynx is clear and moist.  Eyes: Pupils are equal, round, and reactive to light. EOM are normal.  Neck: Normal range of motion. Neck supple. No thyromegaly present.  Cardiovascular: Normal rate, regular rhythm and normal heart sounds.  Pulmonary/Chest: Effort normal and breath sounds normal.  Abdominal: Soft. Bowel sounds are normal. There is no tenderness.  Negative carnett's sign. Negative murphy's sign  When he is sitting the pain is located just beneath the angle of his bottom right rib. No  pain with palpation, but that is where he feels the discomfort.   Lymphadenopathy:    He has no cervical adenopathy.  Skin: Skin is warm and dry.  Dry skin on hands   Vitals reviewed.      Assessment/plan: 1. Dry mouth Rule out SS. Checking ANA/thyroid. He's already doing everything he can with biotin, fluids and sucking on candies. Discussed could be gabapentin that is contributing.  - ANA - Comprehensive metabolic panel  2. Right sided  abdominal pain More discomfort over pain. Seems to be more muscle/ligament in nature. Will give more time with heating pad, stretching. If not better or gets worse would f/u with pcp for further testing.  - CBC with Differential/Platelet  3. Other constipation Checking thyroid. Want him to stop stools softener and change to miralax. Can titrate to where he is getting normal BM. May only need three times/week. Increase fluids/fiber and make sure exercising. cologuard done this year and normal. If not getting better, f/u with pcp.  - TSH      Return if symptoms worsen or fail to improve.    Orma Flaming, MD [place]   05/06/2018

## 2018-05-06 NOTE — Patient Instructions (Signed)
Dry mouth: checking labs to rule out auto immune issue called Sjogren syndrome.  Keep using biotin, sucking candies and drinking water    For constipation: I want you to try miralax. May have to titrate this so don't have diarrhea. Increase fiber in your diet and make sure walking and drinking water.   Abdominal pain: I think it could be more muscle/ligament related. Would give it more time and see if goes away. If not, would f/u with Dr. Yong Channel.

## 2018-05-07 ENCOUNTER — Telehealth: Payer: Self-pay | Admitting: Family Medicine

## 2018-05-07 LAB — ANA: Anti Nuclear Antibody(ANA): NEGATIVE

## 2018-05-07 NOTE — Telephone Encounter (Signed)
See note  Copied from [place] [phone]. Topic: General - Other >> May 07, 2018  2:35 PM Judyann Munson wrote: Reason for CRM:  patient is calling to request lab results. Please advise

## 2018-05-07 NOTE — Telephone Encounter (Signed)
Called and spoke to patient and reviewed lab results from Dr Rogers Blocker.

## 2018-06-30 MED FILL — AMOXICILLIN 875 MG TABS: 875 | 10 days supply | Qty: 20 | Fill #0

## 2018-07-08 MED FILL — IBUPROFEN 400 MG TABS: 400 | 8 days supply | Qty: 30 | Fill #0

## 2018-07-08 MED FILL — HYDROCODON-APAP 5-325: 5-325 | 2 days supply | Qty: 9 | Fill #0

## 2018-07-08 MED FILL — AMOXICILLIN 875 MG TABS: 875 | 10 days supply | Qty: 20 | Fill #1

## 2018-07-08 MED FILL — CHLORHEXIDINE 0.12% RINSE: 0.12 | 17 days supply | Qty: 473 | Fill #0

## 2018-07-15 ENCOUNTER — Other Ambulatory Visit: Payer: Self-pay | Admitting: Family Medicine

## 2018-07-15 MED FILL — GABAPENTIN 300 MG CAPSULE: 300 | 90 days supply | Qty: 540 | Fill #0 | Status: TO

## 2018-07-23 ENCOUNTER — Encounter

## 2018-07-23 ENCOUNTER — Ambulatory Visit: Payer: Medicare Other | Admitting: Family Medicine

## 2018-08-17 ENCOUNTER — Encounter: Payer: Self-pay | Admitting: Family Medicine

## 2018-08-17 NOTE — Telephone Encounter (Signed)
See note  Copied from [place] [phone]. Topic: Appointment Scheduling - Scheduling Inquiry for Clinic >> Aug 17, 2018  8:24 AM Nils Flack wrote: Reason for CRM: wife Jeani Hawking called about pt's appt tomorrow. She is asking if Dr Yong Channel can call in lieu of having actual in person appt. Pt is concerned about going out in public given the virus.  Please call [phone]

## 2018-08-18 ENCOUNTER — Other Ambulatory Visit: Payer: Self-pay

## 2018-08-18 ENCOUNTER — Encounter: Payer: Self-pay | Admitting: Family Medicine

## 2018-08-18 ENCOUNTER — Ambulatory Visit (INDEPENDENT_AMBULATORY_CARE_PROVIDER_SITE_OTHER): Payer: Medicare Other | Admitting: Family Medicine

## 2018-08-18 VITALS — BP 146/92 | HR 75 | Temp 98.6°F | Ht 72.0 in | Wt 232.0 lb

## 2018-08-18 DIAGNOSIS — E785 Hyperlipidemia, unspecified: Secondary | ICD-10-CM

## 2018-08-18 DIAGNOSIS — Z6831 Body mass index (BMI) 31.0-31.9, adult: Secondary | ICD-10-CM | POA: Diagnosis not present

## 2018-08-18 DIAGNOSIS — G609 Hereditary and idiopathic neuropathy, unspecified: Secondary | ICD-10-CM | POA: Diagnosis not present

## 2018-08-18 DIAGNOSIS — R739 Hyperglycemia, unspecified: Secondary | ICD-10-CM

## 2018-08-18 DIAGNOSIS — N5201 Erectile dysfunction due to arterial insufficiency: Secondary | ICD-10-CM

## 2018-08-18 DIAGNOSIS — N529 Male erectile dysfunction, unspecified: Secondary | ICD-10-CM | POA: Insufficient documentation

## 2018-08-18 LAB — COMPREHENSIVE METABOLIC PANEL
ALT: 20 U/L (ref 0–53)
AST: 17 U/L (ref 0–37)
Albumin: 4.2 g/dL (ref 3.5–5.2)
Alkaline Phosphatase: 92 U/L (ref 39–117)
BUN: 14 mg/dL (ref 6–23)
CO2: 31 mEq/L (ref 19–32)
Calcium: 9.5 mg/dL (ref 8.4–10.5)
Chloride: 105 mEq/L (ref 96–112)
Creatinine, Ser: 0.87 mg/dL (ref 0.40–1.50)
GFR: 87.35 mL/min (ref >60.00)
Glucose, Bld: 109 mg/dL — ABNORMAL HIGH (ref 70–99)
Potassium: 4.5 mEq/L (ref 3.5–5.1)
Sodium: 140 mEq/L (ref 135–145)
Total Bilirubin: 0.4 mg/dL (ref 0.2–1.2)
Total Protein: 7.1 g/dL (ref 6.0–8.3)

## 2018-08-18 LAB — HEMOGLOBIN A1C: Hgb A1c MFr Bld: 6 % (ref 4.6–6.5)

## 2018-08-18 LAB — CBC
HCT: 43.3 % (ref 39.0–52.0)
HEMOGLOBIN: 14.4 g/dL (ref 13.0–17.0)
MCHC: 33.2 g/dL (ref 30.0–36.0)
MCV: 91.5 fl (ref 78.0–100.0)
PLATELETS: 197 10*3/uL (ref 150.0–400.0)
RBC: 4.73 Mil/uL (ref 4.22–5.81)
RDW: 13.1 % (ref 11.5–15.5)
WBC: 7.1 10*3/uL (ref 4.0–10.5)

## 2018-08-18 LAB — VITAMIN B12: Vitamin B-12: 191 pg/mL — ABNORMAL LOW (ref 211–911)

## 2018-08-18 LAB — LIPID PANEL
Cholesterol: 264 mg/dL — ABNORMAL HIGH (ref 0–200)
HDL: 38 mg/dL — ABNORMAL LOW (ref >39.00)
NonHDL: 225.5
TRIGLYCERIDES: 221 mg/dL — AB (ref 0.0–149.0)
Total CHOL/HDL Ratio: 7
VLDL: 44.2 mg/dL — ABNORMAL HIGH (ref 0.0–40.0)

## 2018-08-18 LAB — TSH: TSH: 1.98 u[IU]/mL (ref 0.35–4.50)

## 2018-08-18 LAB — LDL CHOLESTEROL, DIRECT: LDL DIRECT: 196 mg/dL

## 2018-08-18 MED ORDER — SILDENAFIL CITRATE 20 MG PO TABS: ORAL_TABLET | ORAL | 3 refills | Status: DC

## 2018-08-18 NOTE — Patient Instructions (Addendum)
No changes today- if you decide you want to see neurology please let me know  We will trial sildenafil for help with erections  Your blood pressure trend concerns me. I would like for you to buy/use a home cuff to check at least 4x a week. Your goal is <140/90. If you note in the next few weeks that it is higher than our goal, see me sooner. Otherwise, see me in 6 month. Bring your home cuff and your log of blood pressures with you to visit.    Please stop by lab before you go If you do not have mychart- we will call you about results within 5 business days of [place] receiving them.  If you have mychart- we will send your results within 3 business days of [place] receiving them.  If abnormal or we want to clarify a result, we will call or mychart you to make sure you receive the message.  If you have questions or concerns or don't hear within 5-7 days, please send [place] a message or call us.

## 2018-08-18 NOTE — Assessment & Plan Note (Signed)
S: in last 6 months started having issues getting firm erection- still gets some blood flow. Has good libido A/P: poor control- trial sildenafil

## 2018-08-18 NOTE — Progress Notes (Signed)
Phone [phone]   Subjective:  Charles Harrington. is a 68 y.o. year old very pleasant male patient who presents for/with See problem oriented charting ROS- No chest pain or shortness of breath. No headache or blurry vision. No fever.  No known covid 19 contacts.   Past Medical History-  Patient Active Problem List   Diagnosis Date Noted  . Carpal tunnel syndrome 02/14/2015    Priority: Medium  . Hereditary and idiopathic peripheral neuropathy 02/14/2015    Priority: Medium  . Hyperglycemia 02/14/2015    Priority: Medium  . Asthma     Priority: Medium  . Hyperlipidemia     Priority: Medium  . Hearing loss 02/14/2015    Priority: Low  . GERD (gastroesophageal reflux disease)     Priority: Low  . Chronic back pain     Priority: Low  . Erectile dysfunction 08/18/2018  . Lichen planus 82/42/3536    Medications- reviewed and updated Current Outpatient Medications  Medication Sig Dispense Refill  . DULoxetine (CYMBALTA) 60 MG capsule TAKE 1 CAPSULE EVERY DAY 90 capsule 0  . FLUAD 0.5 ML SUSY ADM 0.5ML IM UTD  0  . gabapentin (NEURONTIN) 300 MG capsule TAKE 3 CAPSULES BY MOUTH TWICE DAILY 540 capsule 1  . ketoconazole (NIZORAL) 2 % cream Apply 1 application topically daily. For up to 3 weeks. See [place] back if not improved (Patient not taking: Reported on 08/18/2018) 60 g 0  . sildenafil (REVATIO) 20 MG tablet Take 2-5 tablets as needed once every 48 hours for erectile dysfunction 50 tablet 3   No current facility-administered medications for this visit.      Objective:  BP (!) 158/90 (BP Location: Left Arm, Patient Position: Sitting, Cuff Size: Normal)   Pulse 75   Temp 98.6 F (37 C) (Oral)   Ht 6' (1.829 m)   Wt 232 lb (105.2 kg)   SpO2 95%   BMI 31.46 kg/m  Gen: NAD, resting comfortably CV: RRR no murmurs rubs or gallops Lungs: CTAB no crackles, wheeze, rhonchi Abdomen: soft/nontender/nondistended/obese Ext: no edema Skin: warm, dry Neuro: normal gait and  speech  He knows to watch feet closely    Assessment and Plan   #elevated blood pressure S: no rx BP Readings from Last 3 Encounters:  08/18/18 (!) 146/92  05/06/18 136/80  01/29/18 120/68  A/P:  This is the first blood pressure check we have seen that has been high. We are going to do some monitoring and he will call me if running high at home or send mychart message.   # hyperglycemia S: has had elevated a1c in past up to 5.8 on last check. Has been cooped up in house and less able to be active and eating more.  A/P: at risk diabetes- update a1c  # Hyperlipidemia/ obesity S:advised patient to start atorvastatin 20mg  last year- he declined.  A/P: update lipids- consier statin again   # Neuropathy S:Patient compliant with cymbalta 60mg . Also uses gabapentin 300 mg- 3 in AM and 3 in PM, has also started takign 2 at lunch. Walking worsens pain. Also has some numbness/tingling in hands along with some pain that carpal tunnel injections did not help. Feels like symptoms are worsening- wakes up in morning with it and seems to be worse in the day. Worse when he gets in recliner.  - had seen neurology years ago A/P:  Worsening- discussed repeat neurology referral and he declines- continue current medicine  Erectile dysfunction S: in last 6  months started having issues getting firm erection- still gets some blood flow. Has good libido A/P: poor control- trial sildenafil    Future Appointments  Date Time Provider [place]  02/16/2019  3:00 PM [facility]   Return in about 1 year (around 08/18/2019) for follow up- or sooner if needed.  Lab/Order associations: fasting  Hyperglycemia - Plan: Hemoglobin A1c  Hyperlipidemia, unspecified hyperlipidemia type - Plan: CBC, Comprehensive metabolic panel, Lipid panel  Hereditary and idiopathic peripheral neuropathy - Plan: Vitamin B12, TSH  Erectile dysfunction due to arterial insufficiency  Meds ordered this  encounter  Medications  . sildenafil (REVATIO) 20 MG tablet    Sig: Take 2-5 tablets as needed once every 48 hours for erectile dysfunction    Dispense:  50 tablet    Refill:  3    Return precautions advised.  Garret Reddish, MD

## 2018-08-20 MED FILL — SILDENAFIL CITRATE 20 MG TA: 20 | 20 days supply | Qty: 50 | Fill #0

## 2018-08-25 ENCOUNTER — Other Ambulatory Visit: Payer: Self-pay

## 2018-08-25 MED ORDER — ATORVASTATIN CALCIUM 40 MG PO TABS: 40.0000 mg | ORAL_TABLET | Freq: Every day | ORAL | 3 refills | Status: DC

## 2018-08-26 ENCOUNTER — Telehealth: Payer: Self-pay | Admitting: Family Medicine

## 2018-08-26 NOTE — Telephone Encounter (Signed)
Copied from [place] [phone]. Topic: Appointment Scheduling - Scheduling Inquiry for Clinic >> Aug 26, 2018 10:10 AM Virl Axe D wrote: Reason for CRM: Pt called to schedule his next four B12 injections per Dr. Ansel Bong instruction. Advised to send CRM. Please reach out to pt's wife to schedule. Jeani Hawking [phone]

## 2018-08-26 NOTE — Telephone Encounter (Signed)
Pt has been scheduled for 08/31/2018 for nv. Pt notified of update.

## 2018-08-30 ENCOUNTER — Other Ambulatory Visit: Payer: Self-pay | Admitting: Family Medicine

## 2018-08-30 MED ORDER — DULOXETINE HCL 60 MG PO CPEP: 60.0000 mg | ORAL_CAPSULE | Freq: Every day | ORAL | 0 refills | Status: DC

## 2018-08-30 MED FILL — DULOXETINE HCL 60 MG CPEP: 60 | 90 days supply | Qty: 90 | Fill #0

## 2018-08-31 ENCOUNTER — Other Ambulatory Visit: Payer: Self-pay

## 2018-08-31 ENCOUNTER — Ambulatory Visit (INDEPENDENT_AMBULATORY_CARE_PROVIDER_SITE_OTHER): Payer: Medicare Other

## 2018-08-31 DIAGNOSIS — E538 Deficiency of other specified B group vitamins: Secondary | ICD-10-CM | POA: Diagnosis not present

## 2018-08-31 MED ORDER — CYANOCOBALAMIN 1000 MCG/ML IJ SOLN
1000.0000 ug | Freq: Once | INTRAMUSCULAR | Status: AC
  Administered 2018-08-31: 1000 ug via INTRAMUSCULAR

## 2018-08-31 NOTE — Progress Notes (Signed)
Per orders of Dr. Yong Channel, injection of vitamin B12 1000 mcg given in right deltoid by Gertie Exon, CMA.  Patient tolerated injection well.

## 2018-09-07 ENCOUNTER — Ambulatory Visit (INDEPENDENT_AMBULATORY_CARE_PROVIDER_SITE_OTHER): Payer: Medicare Other | Admitting: Family Medicine

## 2018-09-07 DIAGNOSIS — E538 Deficiency of other specified B group vitamins: Secondary | ICD-10-CM | POA: Diagnosis not present

## 2018-09-07 MED ORDER — CYANOCOBALAMIN 1000 MCG/ML IJ SOLN
1000.0000 ug | Freq: Once | INTRAMUSCULAR | Status: AC
  Administered 2018-09-07: 1000 ug via INTRAMUSCULAR

## 2018-09-07 NOTE — Patient Instructions (Signed)
There are no preventive care reminders to display for this patient.  Depression screen [facility] 2/9 01/29/2018 10/23/2016 10/15/2015  Decreased Interest 0 0 0  Down, Depressed, Hopeless 0 0 0  PHQ - 2 Score 0 0 0

## 2018-09-07 NOTE — Progress Notes (Signed)
Per orders of Dr. Yong Channel, injection of B12 given in Lt deltoid  by Ally Knodel L Frisco Cordts. Patient tolerated injection well.

## 2018-09-13 ENCOUNTER — Telehealth: Payer: Self-pay | Admitting: Family Medicine

## 2018-09-13 NOTE — Telephone Encounter (Signed)
Copied from [place] [phone]. Topic: General - Other >> Sep 13, 2018  2:29 PM Carolyn Stare wrote:  Damaris Schooner with [facility] it is ok to send CRM, pt req appt for his 3rd b2 injection please contact

## 2018-09-13 NOTE — Telephone Encounter (Signed)
LVM for patient to call back and schedule b12

## 2018-09-14 ENCOUNTER — Ambulatory Visit (INDEPENDENT_AMBULATORY_CARE_PROVIDER_SITE_OTHER): Payer: Medicare Other | Admitting: Family Medicine

## 2018-09-14 ENCOUNTER — Encounter: Payer: Self-pay | Admitting: Family Medicine

## 2018-09-14 DIAGNOSIS — E538 Deficiency of other specified B group vitamins: Secondary | ICD-10-CM | POA: Diagnosis not present

## 2018-09-14 MED ORDER — CYANOCOBALAMIN 1000 MCG/ML IJ SOLN
1000.0000 ug | Freq: Once | INTRAMUSCULAR | Status: AC
  Administered 2018-09-14: 1000 ug via INTRAMUSCULAR

## 2018-09-14 NOTE — Patient Instructions (Signed)
There are no preventive care reminders to display for this patient.  Depression screen [facility] 2/9 01/29/2018 10/23/2016 10/15/2015  Decreased Interest 0 0 0  Down, Depressed, Hopeless 0 0 0  PHQ - 2 Score 0 0 0

## 2018-09-14 NOTE — Progress Notes (Signed)
Per orders of Dr. Yong Channel, injection of B12 given by Verline Lema L Tyus in right deltoid. Patient tolerated injection well. Patient will make appointment for 1week.  09/21/18

## 2018-09-21 ENCOUNTER — Encounter: Payer: Self-pay | Admitting: Family Medicine

## 2018-09-21 ENCOUNTER — Ambulatory Visit (INDEPENDENT_AMBULATORY_CARE_PROVIDER_SITE_OTHER): Payer: Medicare Other | Admitting: Family Medicine

## 2018-09-21 DIAGNOSIS — E538 Deficiency of other specified B group vitamins: Secondary | ICD-10-CM | POA: Diagnosis not present

## 2018-09-21 MED ORDER — CYANOCOBALAMIN 1000 MCG/ML IJ SOLN
1000.0000 ug | Freq: Once | INTRAMUSCULAR | Status: AC
  Administered 2018-09-21: 1000 ug via INTRAMUSCULAR

## 2018-09-21 NOTE — Patient Instructions (Signed)
There are no preventive care reminders to display for this patient.  Depression screen [facility] 2/9 01/29/2018 10/23/2016 10/15/2015  Decreased Interest 0 0 0  Down, Depressed, Hopeless 0 0 0  PHQ - 2 Score 0 0 0

## 2018-09-21 NOTE — Progress Notes (Signed)
Per orders of Dr.Hunter  injection of Vitamin B12 given by Cammy Sanjurjo AndersonGiven in left deltoid.Patient tolerated injection well.  

## 2018-10-04 MED FILL — GABAPENTIN 300 MG CAPSULE: 300 | 90 days supply | Qty: 540 | Fill #0

## 2018-10-08 NOTE — Progress Notes (Signed)
I have reviewed and agree with note, evaluation, plan.   Alcides Nutting, MD  

## 2018-10-08 NOTE — Progress Notes (Signed)
I have reviewed and agree with note, evaluation, plan.   Stephen Hunter, MD  

## 2018-10-13 DIAGNOSIS — C4441 Basal cell carcinoma of skin of scalp and neck: Secondary | ICD-10-CM | POA: Diagnosis not present

## 2018-10-13 DIAGNOSIS — D225 Melanocytic nevi of trunk: Secondary | ICD-10-CM | POA: Diagnosis not present

## 2018-10-13 DIAGNOSIS — L821 Other seborrheic keratosis: Secondary | ICD-10-CM | POA: Diagnosis not present

## 2018-10-13 DIAGNOSIS — L570 Actinic keratosis: Secondary | ICD-10-CM | POA: Diagnosis not present

## 2018-10-13 DIAGNOSIS — D485 Neoplasm of uncertain behavior of skin: Secondary | ICD-10-CM | POA: Diagnosis not present

## 2018-10-13 DIAGNOSIS — L438 Other lichen planus: Secondary | ICD-10-CM | POA: Diagnosis not present

## 2018-10-13 MED FILL — FLUOROURACIL 5 % CREA: 5 | 14 days supply | Qty: 40 | Fill #0

## 2018-11-26 ENCOUNTER — Other Ambulatory Visit: Payer: Self-pay | Admitting: Family Medicine

## 2018-11-26 MED FILL — DULOXETINE HCL 60 MG CPEP: 60 | 90 days supply | Qty: 90 | Fill #0

## 2019-01-05 ENCOUNTER — Other Ambulatory Visit: Payer: Self-pay | Admitting: Family Medicine

## 2019-01-05 MED FILL — GABAPENTIN 300 MG CAPSULE: 300 | 90 days supply | Qty: 540 | Fill #0

## 2019-01-05 NOTE — Telephone Encounter (Signed)
Pt's wife stated pt will be out of gabapentin (NEURONTIN) 300 MG capsule in 2 days and was told by the pharmacy pt would need to make an appt for further refills. Please advise if appt is needed.  [place], [place] - [place] [phone] (Phone) [phone] (Fax)

## 2019-01-05 NOTE — Telephone Encounter (Signed)
See note

## 2019-02-16 ENCOUNTER — Ambulatory Visit: Payer: Medicare Other

## 2019-02-18 ENCOUNTER — Ambulatory Visit: Payer: Medicare Other | Admitting: Family Medicine

## 2019-03-05 ENCOUNTER — Other Ambulatory Visit: Payer: Self-pay | Admitting: Family Medicine

## 2019-03-07 MED FILL — DULOXETINE HCL 60 MG CPEP: 60 | 90 days supply | Qty: 90 | Fill #0

## 2019-03-12 DIAGNOSIS — Z23 Encounter for immunization: Secondary | ICD-10-CM | POA: Diagnosis not present

## 2019-03-23 NOTE — Progress Notes (Signed)
Phone [phone]   Subjective:  Charles Harrington. is a 68 y.o. year old very pleasant male patient who presents for/with See problem oriented charting Chief Complaint  Patient presents with  . Follow-up- hyperlipidemia   ROS- occasional back pain if mows a lot of grass. No chest pain or shortness of breath. No headache or blurry vision.    Past Medical History-  Patient Active Problem List   Diagnosis Date Noted  . Carpal tunnel syndrome 02/14/2015    Priority: Medium  . Hereditary and idiopathic peripheral neuropathy 02/14/2015    Priority: Medium  . Hyperglycemia 02/14/2015    Priority: Medium  . Asthma     Priority: Medium  . Hyperlipidemia     Priority: Medium  . Hearing loss 02/14/2015    Priority: Low  . GERD (gastroesophageal reflux disease)     Priority: Low  . Chronic back pain     Priority: Low  . B12 deficiency 03/28/2019  . Erectile dysfunction 08/18/2018  . Lichen planus [id number]    Medications- reviewed and updated Current Outpatient Medications  Medication Sig Dispense Refill  . atorvastatin (LIPITOR) 40 MG tablet Take 1 tablet (40 mg total) by mouth daily. 90 tablet 3  . DULoxetine (CYMBALTA) 60 MG capsule Take 1 capsule (60 mg total) by mouth daily. 90 capsule 2  . gabapentin (NEURONTIN) 300 MG capsule Take 3 capsules (900 mg total) by mouth 2 (two) times daily. 540 capsule 2  . sildenafil (REVATIO) 20 MG tablet Take 2-5 tablets as needed once every 48 hours for erectile dysfunction 50 tablet 3   No current facility-administered medications for this visit.      Objective:  BP (!) 118/50   Pulse 74   Temp 98.7 F (37.1 C)   Ht 6' (1.829 m)   Wt 199 lb (90.3 kg)   SpO2 97%   BMI 26.99 kg/m  Gen: NAD, resting comfortably CV: RRR no murmurs rubs or gallops Lungs: CTAB no crackles, wheeze, rhonchi Abdomen: soft/nontender/nondistended/normal bowel sounds.   Ext: no edema Skin: warm, dry Neuro: normal gait and speech    Assessment  and Plan   #hyperlipidemia S: compliant with atorvastatin 40mg  daily Lab Results  Component Value Date   CHOL 264 (H) 08/18/2018   HDL 38.00 (L) 08/18/2018   LDLCALC 172 (H) 10/08/2015   LDLDIRECT 196.0 08/18/2018   TRIG 221.0 (H) 08/18/2018   CHOLHDL 7 08/18/2018   A/P: hopefully improved- update direct LDL today  # Neuropathy S:complaint with Cymbalta 60mg  and gabapentin. Feels liek its starting to come up his ankles. Also getting some in his hands- carpal tunnel injections did not help.   Has seen neuro years ago per his report and not particularly helpful A/P: Stable. Continue current medications.     # hyperglycemia/insulin resistance S:  Wife has really cleaned up his diet. Working and trying to do some walking as well. Has dropped 33 lbs since march!  Lab Results  Component Value Date   HGBA1C 6.0 08/18/2018   HGBA1C 5.8 (H) 07/10/2017   HGBA1C 5.8 03/03/2014   A/P: suspect improved a1c- updated today. Continue excellent lifestyle improvements  # b12 deficiency S: neuropathy worsened last visit- b12 was low and did a month of shots. Now taking b12 orally 1065mcg  A/P: hopefully controlled- update b12 today   #BP elevation- last visit- resolved with weight loss  #GERD- had been having reflux but resolved with weight loss.   # has some ED issues- sildenafil helps  somewhat with firmness- not having to use all the time.   #screen prostate cancer with labs  Lab Results  Component Value Date   PSA 1.31 10/08/2015   PSA 1.58 01/24/2013   #asthma- no recent issues- would be willing to refill albuterol if he needs it but hs not needed lately  Recommended follow up: 6-9 months  Lab/Order associations:   ICD-10-CM   1. Hyperlipidemia, unspecified hyperlipidemia type  E78.5 LDL cholesterol, direct    Comprehensive metabolic panel  2. Hyperglycemia  R73.9 Hemoglobin A1c  3. Hereditary and idiopathic peripheral neuropathy  G60.9   4. Erectile dysfunction due to  arterial insufficiency  N52.01   5. Need for pneumococcal vaccination  Z23 Pneumococcal polysaccharide vaccine 23-valent greater than or equal to 2yo subcutaneous/IM  6. B12 deficiency  E53.8 Vitamin B12    Meds ordered this encounter  Medications  . atorvastatin (LIPITOR) 40 MG tablet    Sig: Take 1 tablet (40 mg total) by mouth daily.    Dispense:  90 tablet    Refill:  3  . DULoxetine (CYMBALTA) 60 MG capsule    Sig: Take 1 capsule (60 mg total) by mouth daily.    Dispense:  90 capsule    Refill:  2  . gabapentin (NEURONTIN) 300 MG capsule    Sig: Take 3 capsules (900 mg total) by mouth 2 (two) times daily.    Dispense:  540 capsule    Refill:  2    Return precautions advised.  Garret Reddish, MD

## 2019-03-23 NOTE — Patient Instructions (Addendum)
Health Maintenance Due  Topic Date Due  . PNA vac Low Risk Adult (2 of 2 - PPSV23)-today 10/23/2018  . INFLUENZA VACCINE -03/17/2019 ([facility]) 01/01/2019   Fantastic job with weight loss- blood pressure has responded really well and hoping your labs do also. Tell your wife a big thank you for [place]!   No changes today unless labs lead [place] to make changes  6-9 month follow up or sooner if you need [place].   Please stop by lab before you go If you do not have mychart- we will call you about results within 5 business days of [place] receiving them.  If you have mychart- we will send your results within 3 business days of [place] receiving them.  If abnormal or we want to clarify a result, we will call or mychart you to make sure you receive the message.  If you have questions or concerns or don't hear within 5-7 days, please send [place] a message or call us.

## 2019-03-28 ENCOUNTER — Other Ambulatory Visit: Payer: Self-pay

## 2019-03-28 ENCOUNTER — Encounter: Payer: Self-pay | Admitting: Family Medicine

## 2019-03-28 ENCOUNTER — Ambulatory Visit (INDEPENDENT_AMBULATORY_CARE_PROVIDER_SITE_OTHER): Payer: Medicare Other | Admitting: Family Medicine

## 2019-03-28 VITALS — BP 118/50 | HR 74 | Temp 98.7°F | Ht 72.0 in | Wt 199.0 lb

## 2019-03-28 DIAGNOSIS — E538 Deficiency of other specified B group vitamins: Secondary | ICD-10-CM | POA: Diagnosis not present

## 2019-03-28 DIAGNOSIS — G609 Hereditary and idiopathic neuropathy, unspecified: Secondary | ICD-10-CM | POA: Diagnosis not present

## 2019-03-28 DIAGNOSIS — N5201 Erectile dysfunction due to arterial insufficiency: Secondary | ICD-10-CM | POA: Diagnosis not present

## 2019-03-28 DIAGNOSIS — E785 Hyperlipidemia, unspecified: Secondary | ICD-10-CM | POA: Diagnosis not present

## 2019-03-28 DIAGNOSIS — Z23 Encounter for immunization: Secondary | ICD-10-CM | POA: Diagnosis not present

## 2019-03-28 DIAGNOSIS — R739 Hyperglycemia, unspecified: Secondary | ICD-10-CM | POA: Diagnosis not present

## 2019-03-28 MED ORDER — DULOXETINE HCL 60 MG PO CPEP: 60.0000 mg | ORAL_CAPSULE | Freq: Every day | ORAL | 2 refills | Status: DC

## 2019-03-28 MED ORDER — ATORVASTATIN CALCIUM 40 MG PO TABS: 40.0000 mg | ORAL_TABLET | Freq: Every day | ORAL | 3 refills | Status: DC

## 2019-03-28 MED ORDER — GABAPENTIN 300 MG PO CAPS: 900.0000 mg | ORAL_CAPSULE | Freq: Two times a day (BID) | ORAL | 2 refills | Status: DC

## 2019-03-29 LAB — COMPREHENSIVE METABOLIC PANEL
ALT: 12 U/L (ref 0–53)
AST: 15 U/L (ref 0–37)
Albumin: 4.2 g/dL (ref 3.5–5.2)
Alkaline Phosphatase: 98 U/L (ref 39–117)
BUN: 16 mg/dL (ref 6–23)
CO2: 29 mEq/L (ref 19–32)
Calcium: 9.4 mg/dL (ref 8.4–10.5)
Chloride: 102 mEq/L (ref 96–112)
Creatinine, Ser: 0.77 mg/dL (ref 0.40–1.50)
GFR: 100.38 mL/min (ref >60.00)
Glucose, Bld: 97 mg/dL (ref 70–99)
Potassium: 5.2 mEq/L — ABNORMAL HIGH (ref 3.5–5.1)
Sodium: 138 mEq/L (ref 135–145)
Total Bilirubin: 0.3 mg/dL (ref 0.2–1.2)
Total Protein: 6.7 g/dL (ref 6.0–8.3)

## 2019-03-29 LAB — HEMOGLOBIN A1C: Hgb A1c MFr Bld: 5.7 % (ref 4.6–6.5)

## 2019-03-29 LAB — LDL CHOLESTEROL, DIRECT: Direct LDL: 116 mg/dL

## 2019-04-04 ENCOUNTER — Other Ambulatory Visit: Payer: Self-pay

## 2019-04-04 MED FILL — GABAPENTIN 300 MG CAPSULE: 300 | 90 days supply | Qty: 540 | Fill #0

## 2019-05-19 ENCOUNTER — Telehealth: Payer: Self-pay | Admitting: Family Medicine

## 2019-05-19 NOTE — Telephone Encounter (Signed)
I left a message asking the patient to call and schedule Medicare AWV with Loma Sousa ([place]).  If patient calls back, please schedule Medicare Wellness Visit at next available opening. Last AWV 01/29/18 [facility] ([place])

## 2019-06-07 MED FILL — DULoxetine HCL 60 MG CPEP: 60 | 90 days supply | Qty: 90 | Fill #0

## 2019-07-01 MED FILL — GABAPENTIN 300 MG CAPSULE: 300 | 90 days supply | Qty: 540 | Fill #1

## 2019-07-26 ENCOUNTER — Telehealth: Payer: Self-pay

## 2019-07-26 ENCOUNTER — Other Ambulatory Visit: Payer: Self-pay

## 2019-07-26 ENCOUNTER — Ambulatory Visit (INDEPENDENT_AMBULATORY_CARE_PROVIDER_SITE_OTHER): Payer: PPO

## 2019-07-26 DIAGNOSIS — Z0000 Encounter for general adult medical examination without abnormal findings: Secondary | ICD-10-CM

## 2019-07-26 NOTE — Progress Notes (Signed)
This visit is being conducted via phone call due to the COVID-19 pandemic. This patient has given me verbal consent via phone to conduct this visit, patient states they are participating from their home address. Some vital signs may be absent or patient reported.   Patient identification: identified by name, DOB, and current address.  Location provider: [facility], Office Persons participating in the virtual visit: Denman George LPN, patient, and Dr. Garret Reddish     Subjective:   Charles Harrington. is a 69 y.o. male who presents for Medicare Annual/Subsequent preventive examination.  Review of Systems:   Cardiac Risk Factors include: advanced age (>87men, >52 women);male gender;dyslipidemia    Objective:    Vitals: There were no vitals taken for this visit.  There is no height or weight on file to calculate BMI.  Advanced Directives 07/26/2019 01/29/2018 08/27/2011 08/26/2011  Does Patient Have a Medical Advance Directive? Yes Yes Patient has advance directive, copy not in chart Patient has advance directive, copy not in chart  Type of Advance Directive Living will;Healthcare Power of Attorney Living will;Healthcare Power of [place];Living will [place];Living will  Does patient want to make changes to medical advance directive? No - Patient declined No - Patient declined - -  Copy of [place] in Chart? No - copy requested No - copy requested Copy requested from family Copy requested from family  Pre-existing out of facility DNR order (yellow form or pink MOST form) - - No -    Tobacco Social History   Tobacco Use  Smoking Status Never Smoker  Smokeless Tobacco Never Used     Counseling given: Not Answered   Clinical Intake:  Pre-visit preparation completed: Yes  Pain : No/denies pain  Diabetes: No  How often do you need to have someone help you when you read instructions, pamphlets, or other  written materials from your doctor or pharmacy?: 1 - Never  Interpreter Needed?: No  Information entered by :: Denman George LPN  Past Medical History:  Diagnosis Date  . Asthma    albuterol once every other week, likely cat allergy  . Chronic back pain    mild after back surgery L4 and L5 ruptured disc with surgery 2013  . GERD (gastroesophageal reflux disease)    omeprazole prn  . Hyperlipidemia    no rx  . Neuropathy    gabapentin 300mg  BID   Past Surgical History:  Procedure Laterality Date  . CARPAL TUNNEL RELEASE Right   . Colonscopy    . LUMBAR LAMINECTOMY/DECOMPRESSION MICRODISCECTOMY  08/27/2011   Procedure: LUMBAR LAMINECTOMY/DECOMPRESSION MICRODISCECTOMY 2 LEVELS;  Surgeon: Floyce Stakes, MD;  Location: [facility];  Service: Neurosurgery;  Laterality: Left;  Left Lumbar four- five, five-sacral one Diskectomy   Family History  Problem Relation Age of Onset  . Hypercholesterolemia Mother        father, sisters, brothers  . Stroke Mother        late 35s, early 23s  . Diabetes Mother   . Congestive Heart Failure Mother   . Heart disease Sister        CABG sister 34- heavy smoker   . Hypercholesterolemia Sister   . Hypercholesterolemia Father   . Hypercholesterolemia Brother   . Hypercholesterolemia Sister   . Hypercholesterolemia Sister   . Hypercholesterolemia Sister   . Early death Sister   . Hydrocephalus Sister   . Hypercholesterolemia Sister   . Hypercholesterolemia Brother   .  Pancreatic cancer Brother   . Cancer Brother   . Hypercholesterolemia Brother   . Hypercholesterolemia Brother   . Anesthesia problems Neg Hx    Social History   Socioeconomic History  . Marital status: Married    Spouse name: Not on file  . Number of children: Not on file  . Years of education: Not on file  . Highest education level: Not on file  Occupational History  . Not on file  Tobacco Use  . Smoking status: Never Smoker  . Smokeless tobacco: Never Used    Substance and Sexual Activity  . Alcohol use: Yes    Alcohol/week: 0.0 standard drinks    Comment: rarely 2 beers a year  . Drug use: No  . Sexual activity: Not on file  Other Topics Concern  . Not on file  Social History Narrative   Family: Married 16 years in 2016. No children. 1 dog- pit bull and lab mix. Lives wife.    2nd youngest of 59      Work: Magazine features editor for [employer]   HS degree      Hobbies: rest, deer and rabbit hunt   Social Determinants of Radio broadcast assistant Strain:   . Difficulty of Paying Living Expenses: Not on file  Food Insecurity:   . Worried About Charity fundraiser in the Last Year: Not on file  . Ran Out of Food in the Last Year: Not on file  Transportation Needs:   . Lack of Transportation (Medical): Not on file  . Lack of Transportation (Non-Medical): Not on file  Physical Activity:   . Days of Exercise per Week: Not on file  . Minutes of Exercise per Session: Not on file  Stress:   . Feeling of Stress : Not on file  Social Connections:   . Frequency of Communication with Friends and Family: Not on file  . Frequency of Social Gatherings with Friends and Family: Not on file  . Attends Religious Services: Not on file  . Active Member of Clubs or Organizations: Not on file  . Attends Archivist Meetings: Not on file  . Marital Status: Not on file    Outpatient Encounter Medications as of 07/26/2019  Medication Sig  . atorvastatin (LIPITOR) 40 MG tablet Take 1 tablet (40 mg total) by mouth daily.  . DULoxetine (CYMBALTA) 60 MG capsule Take 1 capsule (60 mg total) by mouth daily.  Marland Kitchen gabapentin (NEURONTIN) 300 MG capsule Take 3 capsules (900 mg total) by mouth 2 (two) times daily.  . sildenafil (REVATIO) 20 MG tablet Take 2-5 tablets as needed once every 48 hours for erectile dysfunction   No facility-administered encounter medications on file as of 07/26/2019.    Activities of Daily Living In your present state  of health, do you have any difficulty performing the following activities: 07/26/2019  Hearing? Y  Vision? N  Difficulty concentrating or making decisions? N  Walking or climbing stairs? N  Dressing or bathing? N  Doing errands, shopping? N  Preparing Food and eating ? N  Using the Toilet? N  In the past six months, have you accidently leaked urine? N  Do you have problems with loss of bowel control? N  Managing your Medications? N  Managing your Finances? N  Housekeeping or managing your Housekeeping? N  Some recent data might be hidden    Patient Care Team: Marin Olp, MD as PCP - General (Family Medicine) Danella Sensing,  MD as Consulting Physician (Dermatology)   Assessment:   This is a routine wellness examination for Charles Harrington.  Exercise Activities and Dietary recommendations Current Exercise Habits: Home exercise routine, Type of exercise: Other - see comments(yardwork and outside activities), Time (Minutes): 45, Frequency (Times/Week): 4, Weekly Exercise (Minutes/Week): 180, Intensity: Moderate  Goals    . DIET - INCREASE WATER INTAKE     Drink more water, less Gatorade       Fall Risk Fall Risk  07/26/2019 01/29/2018 10/23/2016 10/15/2015  Falls in the past year? 0 No No No  Number falls in past yr: 0 - - -  Injury with Fall? 0 - - -  Follow up Falls evaluation completed;Education provided;Falls prevention discussed - - -   Is the patient's home free of loose throw rugs in walkways, pet beds, electrical cords, etc?   yes      Grab bars in the bathroom? yes      Handrails on the stairs?   yes      Adequate lighting?   yes  Depression Screen PHQ 2/9 Scores 07/26/2019 03/28/2019 01/29/2018 10/23/2016  PHQ - 2 Score 0 0 0 0    Cognitive Function     6CIT Screen 07/26/2019 01/29/2018  What Year? 0 points 0 points  What month? 0 points 0 points  What time? 0 points 0 points  Count back from 20 0 points 0 points  Months in reverse 0 points 0 points  Repeat phrase 0  points -  Total Score 0 -    Immunization History  Administered Date(s) Administered  . Influenza,inj,Quad PF,6+ Mos 02/14/2015, 04/11/2016  . Influenza-Unspecified 05/04/2017, 04/06/2018, 03/17/2019  . Pneumococcal Conjugate-13 10/22/2017  . Pneumococcal Polysaccharide-23 03/28/2019  . Tdap 10/15/2015    Qualifies for Shingles Vaccine? Discussed and patient will check with pharmacy for coverage.  Patient education handout provided    Screening Tests Health Maintenance  Topic Date Due  . Fecal DNA (Cologuard)  11/12/2020  . TETANUS/TDAP  10/14/2025  . INFLUENZA VACCINE  Completed  . Hepatitis C Screening  Completed  . PNA vac Low Risk Adult  Completed   Cancer Screenings: Lung: Low Dose CT Chest recommended if Age 27-80 years, 30 pack-year currently smoking OR have quit w/in 15years. Patient does not qualify. Colorectal: Cologuard normal 11/12/17      Plan:  I have personally reviewed and addressed the Medicare Annual Wellness questionnaire and have noted the following in the patient's chart:  A. Medical and social history B. Use of alcohol, tobacco or illicit drugs  C. Current medications and supplements D. Functional ability and status E.  Nutritional status F.  Physical activity G. Advance directives H. List of other physicians I.  Hospitalizations, surgeries, and ER visits in previous 12 months J.  [place] such as hearing and vision if needed, cognitive and depression L. Referrals, records requested, and appointments- none   In addition, I have reviewed and discussed with patient certain preventive protocols, quality metrics, and best practice recommendations. A written personalized care plan for preventive services as well as general preventive health recommendations were provided to patient.   Signed,  Denman George, LPN  Nurse Health Advisor   Nurse Notes: no additional

## 2019-07-26 NOTE — Patient Instructions (Signed)
[name] , Thank you for taking time to come for your Medicare Wellness Visit. I appreciate your ongoing commitment to your health goals. Please review the following plan we discussed and let me know if I can assist you in the future.   Screening recommendations/referrals: Colorectal Screening: up to date; Cologuard normal 11/12/17 (repeat 10/2020)  Vision and Dental Exams: Recommended annual ophthalmology exams for early detection of glaucoma and other disorders of the eye Recommended annual dental exams for proper oral hygiene  Vaccinations: Influenza vaccine: completed 03/17/19 Pneumococcal vaccine: up to date; last 03/28/19 Tdap vaccine: up to date; last 10/15/15  Shingles vaccine: Please call your insurance company to determine your out of pocket expense for the Shingrix vaccine. You may receive this vaccine at your local pharmacy. (see handout)   Advanced directives: Please bring a copy of your POA (Power of Attorney) and/or Living Will to your next appointment.  Goals: Recommend to drink at least 6-8 8oz glasses of water per day and consume a balanced diet rich in fresh fruits and vegetables.   Next appointment: Please schedule your Annual Wellness Visit with your Nurse Health Advisor in one year.  Please schedule an appointment with Dr. Yong Harrington for June   Preventive Care 65 Years and Older, Male Preventive care refers to lifestyle choices and visits with your health care provider that can promote health and wellness. What does preventive care include?  A yearly physical exam. This is also called an annual well check.  Dental exams once or twice a year.  Routine eye exams. Ask your health care provider how often you should have your eyes checked.  Personal lifestyle choices, including:  Daily care of your teeth and gums.  Regular physical activity.  Eating a healthy diet.  Avoiding tobacco and drug use.  Limiting alcohol use.  Practicing safe sex.  Taking low doses  of aspirin every day if recommended by your health care provider..  Taking vitamin and mineral supplements as recommended by your health care provider. What happens during an annual well check? The services and screenings done by your health care provider during your annual well check will depend on your age, overall health, lifestyle risk factors, and family history of disease. Counseling  Your health care provider may ask you questions about your:  Alcohol use.  Tobacco use.  Drug use.  Emotional well-being.  Home and relationship well-being.  Sexual activity.  Eating habits.  History of falls.  Memory and ability to understand (cognition).  Work and work Statistician. Screening  You may have the following tests or measurements:  Height, weight, and BMI.  Blood pressure.  Lipid and cholesterol levels. These may be checked every 5 years, or more frequently if you are over 27 years old.  Skin check.  Lung cancer screening. You may have this screening every year starting at age 67 if you have a 30-pack-year history of smoking and currently smoke or have quit within the past 15 years.  Fecal occult blood test (FOBT) of the stool. You may have this test every year starting at age 36.  Flexible sigmoidoscopy or colonoscopy. You may have a sigmoidoscopy every 5 years or a colonoscopy every 10 years starting at age 66.  Prostate cancer screening. Recommendations will vary depending on your family history and other risks.  Hepatitis C blood test.  Hepatitis B blood test.  Sexually transmitted disease (STD) testing.  Diabetes screening. This is done by checking your blood sugar (glucose) after you have not  eaten for a while (fasting). You may have this done every 1-3 years.  Abdominal aortic aneurysm (AAA) screening. You may need this if you are a current or former smoker.  Osteoporosis. You may be screened starting at age 49 if you are at high risk. Talk with your  health care provider about your test results, treatment options, and if necessary, the need for more tests. Vaccines  Your health care provider may recommend certain vaccines, such as:  Influenza vaccine. This is recommended every year.  Tetanus, diphtheria, and acellular pertussis (Tdap, Td) vaccine. You may need a Td booster every 10 years.  Zoster vaccine. You may need this after age 70.  Pneumococcal 13-valent conjugate (PCV13) vaccine. One dose is recommended after age 35.  Pneumococcal polysaccharide (PPSV23) vaccine. One dose is recommended after age 40. Talk to your health care provider about which screenings and vaccines you need and how often you need them. This information is not intended to replace advice given to you by your health care provider. Make sure you discuss any questions you have with your health care provider. Document Released: 06/15/2015 Document Revised: 02/06/2016 Document Reviewed: 03/20/2015 Elsevier Interactive Patient Education  2017 [place] Prevention in the Home Falls can cause injuries. They can happen to people of all ages. There are many things you can do to make your home safe and to help prevent falls. What can I do on the outside of my home?  Regularly fix the edges of walkways and driveways and fix any cracks.  Remove anything that might make you trip as you walk through a door, such as a raised step or threshold.  Trim any bushes or trees on the path to your home.  Use bright outdoor lighting.  Clear any walking paths of anything that might make someone trip, such as rocks or tools.  Regularly check to see if handrails are loose or broken. Make sure that both sides of any steps have handrails.  Any raised decks and porches should have guardrails on the edges.  Have any leaves, snow, or ice cleared regularly.  Use sand or salt on walking paths during winter.  Clean up any spills in your garage right away. This includes oil  or grease spills. What can I do in the bathroom?  Use night lights.  Install grab bars by the toilet and in the tub and shower. Do not use towel bars as grab bars.  Use non-skid mats or decals in the tub or shower.  If you need to sit down in the shower, use a plastic, non-slip stool.  Keep the floor dry. Clean up any water that spills on the floor as soon as it happens.  Remove soap buildup in the tub or shower regularly.  Attach bath mats securely with double-sided non-slip rug tape.  Do not have throw rugs and other things on the floor that can make you trip. What can I do in the bedroom?  Use night lights.  Make sure that you have a light by your bed that is easy to reach.  Do not use any sheets or blankets that are too big for your bed. They should not hang down onto the floor.  Have a firm chair that has side arms. You can use this for support while you get dressed.  Do not have throw rugs and other things on the floor that can make you trip. What can I do in the kitchen?  Clean up any spills  right away.  Avoid walking on wet floors.  Keep items that you use a lot in easy-to-reach places.  If you need to reach something above you, use a strong step stool that has a grab bar.  Keep electrical cords out of the way.  Do not use floor polish or wax that makes floors slippery. If you must use wax, use non-skid floor wax.  Do not have throw rugs and other things on the floor that can make you trip. What can I do with my stairs?  Do not leave any items on the stairs.  Make sure that there are handrails on both sides of the stairs and use them. Fix handrails that are broken or loose. Make sure that handrails are as long as the stairways.  Check any carpeting to make sure that it is firmly attached to the stairs. Fix any carpet that is loose or worn.  Avoid having throw rugs at the top or bottom of the stairs. If you do have throw rugs, attach them to the floor with  carpet tape.  Make sure that you have a light switch at the top of the stairs and the bottom of the stairs. If you do not have them, ask someone to add them for you. What else can I do to help prevent falls?  Wear shoes that:  Do not have high heels.  Have rubber bottoms.  Are comfortable and fit you well.  Are closed at the toe. Do not wear sandals.  If you use a stepladder:  Make sure that it is fully opened. Do not climb a closed stepladder.  Make sure that both sides of the stepladder are locked into place.  Ask someone to hold it for you, if possible.  Clearly mark and make sure that you can see:  Any grab bars or handrails.  First and last steps.  Where the edge of each step is.  Use tools that help you move around (mobility aids) if they are needed. These include:  Canes.  Walkers.  Scooters.  Crutches.  Turn on the lights when you go into a dark area. Replace any light bulbs as soon as they burn out.  Set up your furniture so you have a clear path. Avoid moving your furniture around.  If any of your floors are uneven, fix them.  If there are any pets around you, be aware of where they are.  Review your medicines with your doctor. Some medicines can make you feel dizzy. This can increase your chance of falling. Ask your doctor what other things that you can do to help prevent falls. This information is not intended to replace advice given to you by your health care provider. Make sure you discuss any questions you have with your health care provider. Document Released: 03/15/2009 Document Revised: 10/25/2015 Document Reviewed: 06/23/2014 Elsevier Interactive Patient Education  2017 [employer].

## 2019-07-29 NOTE — Telephone Encounter (Signed)
Error

## 2019-09-08 MED FILL — DULoxetine HCL 60 MG CPEP: 60 | 90 days supply | Qty: 90 | Fill #1

## 2019-10-03 MED FILL — GABAPENTIN 300 MG CAPSULE: 300 | 90 days supply | Qty: 540 | Fill #2

## 2019-11-25 ENCOUNTER — Emergency Department (HOSPITAL_BASED_OUTPATIENT_CLINIC_OR_DEPARTMENT_OTHER): Payer: PPO

## 2019-11-25 ENCOUNTER — Other Ambulatory Visit: Payer: Self-pay

## 2019-11-25 ENCOUNTER — Telehealth: Payer: Self-pay

## 2019-11-25 ENCOUNTER — Emergency Department (HOSPITAL_BASED_OUTPATIENT_CLINIC_OR_DEPARTMENT_OTHER)
Admission: EM | Admit: 2019-11-25 | Discharge: 2019-11-25 | Disposition: A | Discharge status: Home / Self Care | Payer: PPO | Attending: Emergency Medicine | Admitting: Emergency Medicine

## 2019-11-25 ENCOUNTER — Encounter (HOSPITAL_BASED_OUTPATIENT_CLINIC_OR_DEPARTMENT_OTHER): Payer: Self-pay | Admitting: *Deleted

## 2019-11-25 DIAGNOSIS — Z9889 Other specified postprocedural states: Secondary | ICD-10-CM | POA: Insufficient documentation

## 2019-11-25 DIAGNOSIS — Y999 Unspecified external cause status: Secondary | ICD-10-CM | POA: Diagnosis not present

## 2019-11-25 DIAGNOSIS — Y939 Activity, unspecified: Secondary | ICD-10-CM | POA: Diagnosis not present

## 2019-11-25 DIAGNOSIS — S0990XA Unspecified injury of head, initial encounter: Secondary | ICD-10-CM | POA: Insufficient documentation

## 2019-11-25 DIAGNOSIS — M542 Cervicalgia: Secondary | ICD-10-CM | POA: Diagnosis not present

## 2019-11-25 DIAGNOSIS — Y92524 Gas station as the place of occurrence of the external cause: Secondary | ICD-10-CM | POA: Diagnosis not present

## 2019-11-25 DIAGNOSIS — S0993XA Unspecified injury of face, initial encounter: Secondary | ICD-10-CM | POA: Diagnosis not present

## 2019-11-25 DIAGNOSIS — J45909 Unspecified asthma, uncomplicated: Secondary | ICD-10-CM | POA: Diagnosis not present

## 2019-11-25 MED ORDER — METHOCARBAMOL 500 MG PO TABS
500.0000 mg | ORAL_TABLET | Freq: Two times a day (BID) | ORAL | 0 refills | Status: DC
Start: 2019-11-25 — End: 2020-05-03

## 2019-11-25 MED ORDER — DIPHENHYDRAMINE HCL 50 MG/ML IJ SOLN
12.5000 mg | Freq: Once | INTRAMUSCULAR | Status: AC
  Administered 2019-11-25: 12.5 mg via INTRAMUSCULAR
  Filled 2019-11-25: qty 1

## 2019-11-25 MED ORDER — ACETAMINOPHEN ER 650 MG PO TBCR
650.0000 mg | EXTENDED_RELEASE_TABLET | Freq: Three times a day (TID) | ORAL | 0 refills | Status: DC | PRN
Start: 2019-11-25 — End: 2021-10-22

## 2019-11-25 MED ORDER — PROCHLORPERAZINE EDISYLATE 10 MG/2ML IJ SOLN
10.0000 mg | Freq: Once | INTRAMUSCULAR | Status: AC
  Administered 2019-11-25: 10 mg via INTRAMUSCULAR
  Filled 2019-11-25: qty 2

## 2019-11-25 NOTE — Telephone Encounter (Signed)
Agree with assessment and plan 
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
## 2019-11-25 NOTE — Discharge Instructions (Addendum)
As discussed, all your CT images were unremarkable for any acute abnormalities.  I suspect you could have had a concussion.  I am sending you home with Tylenol as needed for pain.  Amoxicillin, the muscle relaxer.  Take as prescribed.  Muscle relaxer can cause drowsiness do not drive or operate machinery on the medication.  Please follow-up with PCP if symptoms not improved within the next week.  I have also included the number for the concussion clinic.  Call to schedule point if symptoms not improved within the next week.  Return to the ER for new or worsening symptoms.
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
## 2019-12-05 MED FILL — DULoxetine HCL 60 MG CPEP: 60 | 90 days supply | Qty: 90 | Fill #2

## 2019-12-08 DIAGNOSIS — H2513 Age-related nuclear cataract, bilateral: Secondary | ICD-10-CM | POA: Diagnosis not present

## 2019-12-08 DIAGNOSIS — H40033 Anatomical narrow angle, bilateral: Secondary | ICD-10-CM | POA: Diagnosis not present

## 2019-12-30 ENCOUNTER — Other Ambulatory Visit (HOSPITAL_COMMUNITY): Payer: Self-pay | Admitting: Ophthalmology

## 2019-12-30 DIAGNOSIS — H353131 Nonexudative age-related macular degeneration, bilateral, early dry stage: Secondary | ICD-10-CM | POA: Diagnosis not present

## 2019-12-30 DIAGNOSIS — H40053 Ocular hypertension, bilateral: Secondary | ICD-10-CM | POA: Diagnosis not present

## 2019-12-30 MED FILL — LATANOPROST 0.005% EYE DRP: 0.005 | 50 days supply | Qty: 5 | Fill #0

## 2020-01-02 ENCOUNTER — Other Ambulatory Visit: Payer: Self-pay | Admitting: Family Medicine

## 2020-01-04 ENCOUNTER — Other Ambulatory Visit: Payer: Self-pay

## 2020-01-04 ENCOUNTER — Telehealth: Payer: Self-pay | Admitting: Family Medicine

## 2020-01-04 MED ORDER — GABAPENTIN 300 MG PO CAPS: 900.0000 mg | ORAL_CAPSULE | Freq: Two times a day (BID) | ORAL | 2 refills | Status: DC

## 2020-01-04 MED FILL — GABAPENTIN 300 MG CAPSULE: 300 | 90 days supply | Qty: 540 | Fill #0

## 2020-01-04 NOTE — Telephone Encounter (Signed)
MEDICATION: gabapentin (NEURONTIN) 300 MG capsule  PHARMACY:  [place], [place] - [address] [facility]. Phone:  [phone]  Fax:  [phone]       Comments: Completely Out   **Let patient know to contact pharmacy at the end of the day to make sure medication is ready. **  ** Please notify patient to allow 48-72 hours to process**  **Encourage patient to contact the pharmacy for refills or they can request refills through [facility]**

## 2020-01-04 NOTE — Telephone Encounter (Signed)
Have sent in refill

## 2020-03-12 ENCOUNTER — Other Ambulatory Visit: Payer: Self-pay | Admitting: Family Medicine

## 2020-03-12 MED FILL — LATANOPROST 0.005% EYE DRP: 0.005 | 50 days supply | Qty: 5 | Fill #1

## 2020-03-12 MED FILL — DULoxetine HCL 60 MG CPEP: 60 | 90 days supply | Qty: 90 | Fill #0

## 2020-04-03 MED FILL — GABAPENTIN 300 MG CAPSULE: 300 | 90 days supply | Qty: 540 | Fill #1

## 2020-05-02 NOTE — Progress Notes (Signed)
Phone [phone] In person visit   Subjective:   Charles Harrington. is a 69 y.o. year old very pleasant male patient who presents for/with See problem oriented charting Chief Complaint  Patient presents with  . GI Problem    intermittent stomach pain for the last month. Raidiates from stomach to hip, to groin, to leg.     This visit occurred during the SARS-CoV-2 public health emergency.  Safety protocols were in place, including screening questions prior to the visit, additional usage of staff PPE, and extensive cleaning of exam room while observing appropriate contact time as indicated for disinfecting solutions.   Past Medical History-  Patient Active Problem List   Diagnosis Date Noted  . Carpal tunnel syndrome 02/14/2015    Priority: Medium  . Hereditary and idiopathic peripheral neuropathy 02/14/2015    Priority: Medium  . Hyperglycemia 02/14/2015    Priority: Medium  . Asthma     Priority: Medium  . Hyperlipidemia     Priority: Medium  . Hearing loss 02/14/2015    Priority: Low  . GERD (gastroesophageal reflux disease)     Priority: Low  . Chronic back pain     Priority: Low  . B12 deficiency 03/28/2019  . Erectile dysfunction 08/18/2018  . Lichen planus 00/86/7619    Medications- reviewed and updated Current Outpatient Medications  Medication Sig Dispense Refill  . acetaminophen (TYLENOL 8 HOUR) 650 MG CR tablet Take 1 tablet (650 mg total) by mouth every 8 (eight) hours as needed for pain. 15 tablet 0  . atorvastatin (LIPITOR) 40 MG tablet Take 1 tablet (40 mg total) by mouth daily. 90 tablet 3  . DULoxetine (CYMBALTA) 60 MG capsule TAKE 1 CAPSULE (60 MG TOTAL) BY MOUTH DAILY. 90 capsule 2  . gabapentin (NEURONTIN) 300 MG capsule Take 3 capsules (900 mg total) by mouth 2 (two) times daily. 540 capsule 2  . latanoprost (XALATAN) 0.005 % ophthalmic solution SMARTSIG:1 Drop(s) In Eye(s) Every Evening    . methocarbamol (ROBAXIN) 500 MG tablet Take 1 tablet  (500 mg total) by mouth 2 (two) times daily. (Patient not taking: Reported on 05/03/2020) 20 tablet 0  . sildenafil (REVATIO) 20 MG tablet Take 2-5 tablets as needed once every 48 hours for erectile dysfunction (Patient not taking: Reported on 05/03/2020) 50 tablet 3   No current facility-administered medications for this visit.     Objective:  BP 134/86   Pulse 83   Temp 99 F (37.2 C) (Temporal)   Ht 6' (1.829 m)   Wt 219 lb 9.6 oz (99.6 kg)   SpO2 97%   BMI 29.78 kg/m  Gen: NAD, resting comfortably CV: RRR no murmurs rubs or gallops Lungs: CTAB no crackles, wheeze, rhonchi Abdomen: soft/nontender/nondistended/normal bowel sounds. No rebound or guarding.  Skin: warm, dry GU: Patient with some sagging of skin after prior weight loss.  Scrotum appears small externally-able to palpate testicles in the area of sagging skin but not clearly within the scrotum-difficult examination to understand full anatomy.  Seems to have some tenderness along the right testicle which is deep to the outer edge of external view of scrotum.    Assessment and Plan   #Right groin pain/right lower abdominal pain S: intermittent abdominal pain over the last month- Sometimes gets pain just to the right of his scrotum, right groin, RLQ pain and right side pain. Sometimes can ache in all areas at the same time and other times hurts in just one spot. Most of pain  is just at edge of scrotum. Pain 3/10.   No blood in stool. No melena.  No dysuria or polyuria. Weaker stream with urination. Hemorrhoid back in the summer.   Getting a lot of bloating . Also has had heartburn which has not had in 2 years woke up from sleep with this- took antacid and got better on a few occasions (fully resolved issue)  Also for a few weeks hurting in bilateral low back.   Cold 2 weeks ago and perhaps slightly winded but that has resolved- inhaler for asthma helped.  A/P: 69 year old male with most of his symptoms noted in the right  groin particularly just to the right of his scrotum.  He certainly has some sagging of skin from prior weight loss-which distorts anatomy-unable to get a full testicular exam.  Recommended testicular/scrotal ultrasound to further evaluate.  I do not feel an obvious hernia but I do wonder about a retractile testicle-does not report history of undescended testicle.   -May need urology input. -Considered prostatitis but rectal exam reassuring without pain -With a weaker urinary stream patient does have some BPH on exam but we will also check urinalysis and culture -Also get baseline blood work CBC/CMP -Patient also with some pain in right lower quadrant as well as right side-discussed if no improvement of pain and no cause of pain on ultrasound consider CT of abdomen pelvis  Recommended follow up: Discussed 6 months physical or follow-up Future Appointments  Date Time Provider [place]  05/03/2020  4:30 PM [facility] 1 [facility] [place]    Lab/Order associations:   ICD-10-CM   1. Chronic RLQ pain  R10.31    G89.29   2. RLQ abdominal pain  R10.31 CBC With Differential/Platelet    COMPLETE METABOLIC PANEL WITH GFR    POCT Urinalysis Dipstick (Automated)    Urine Culture    COMPLETE METABOLIC PANEL WITH GFR    CBC With Differential/Platelet    Urine Culture  3. Right groin pain  R10.31 CBC With Differential/Platelet    COMPLETE METABOLIC PANEL WITH GFR    US SCROTUM W/DOPPLER    POCT Urinalysis Dipstick (Automated)    Urine Culture    COMPLETE METABOLIC PANEL WITH GFR    CBC With Differential/Platelet    Urine Culture    Time Spent: 30 minutes of total time (11:40 AM-12:10 PM) was spent on the date of the encounter performing the following actions: chart review prior to seeing the patient, obtaining history, performing a medically necessary exam, counseling on the treatment plan, placing orders, and documenting in our EHR.   Return precautions advised.  Garret Reddish, MD

## 2020-05-02 NOTE — Patient Instructions (Addendum)
Health Maintenance Due  Topic Date Due  . COVID-19 Vaccine (1)- please call us back with dates Never done  . INFLUENZA VACCINE - please call us with date after you receive 01/01/2020   Please stop by lab before you go If you have mychart- we will send your results within 3 business days of [place] receiving them.  If you do not have mychart- we will call you about results within 5 business days of [place] receiving them.  *please note we are currently using [facility] which has a longer processing time than [place] typically so labs may not come back as quickly as in the past *please also note that you will see labs on mychart as soon as they post. I will later go in and write notes on them- will say "notes from Dr. Yong Channel"  We will call you within two weeks about your referral for ultrasound of scrotum to evaluate for cause of pain. If you do not hear within 3 weeks, give [place] a call.   If we do not find cause on this and you have persistent issues may consider CT scan but that uses radiation so want to use that as 2nd step.   New or worsening symptoms please let us know

## 2020-05-03 ENCOUNTER — Ambulatory Visit (HOSPITAL_COMMUNITY)
Admission: RE | Admit: 2020-05-03 | Discharge: 2020-05-03 | Disposition: A | Discharge status: Home / Self Care | Payer: PPO | Source: Ambulatory Visit | Attending: Family Medicine | Admitting: Family Medicine

## 2020-05-03 ENCOUNTER — Ambulatory Visit (INDEPENDENT_AMBULATORY_CARE_PROVIDER_SITE_OTHER): Payer: PPO | Admitting: Family Medicine

## 2020-05-03 ENCOUNTER — Encounter: Payer: Self-pay | Admitting: Family Medicine

## 2020-05-03 ENCOUNTER — Other Ambulatory Visit: Payer: Self-pay

## 2020-05-03 VITALS — BP 134/86 | HR 83 | Temp 99.0°F | Ht 72.0 in | Wt 219.6 lb

## 2020-05-03 DIAGNOSIS — N433 Hydrocele, unspecified: Secondary | ICD-10-CM | POA: Diagnosis not present

## 2020-05-03 DIAGNOSIS — R1031 Right lower quadrant pain: Secondary | ICD-10-CM | POA: Diagnosis not present

## 2020-05-03 DIAGNOSIS — N5082 Scrotal pain: Secondary | ICD-10-CM | POA: Diagnosis not present

## 2020-05-03 LAB — POC URINALSYSI DIPSTICK (AUTOMATED)
Bilirubin, UA: NEGATIVE
Blood, UA: NEGATIVE
Glucose, UA: NEGATIVE
Ketones, UA: NEGATIVE
Leukocytes, UA: NEGATIVE
Nitrite, UA: NEGATIVE
Protein, UA: NEGATIVE
Spec Grav, UA: 1.015 (ref 1.010–1.025)
Urobilinogen, UA: 0.2 E.U./dL
pH, UA: 5.5 (ref 5.0–8.0)

## 2020-05-04 ENCOUNTER — Telehealth: Payer: Self-pay

## 2020-05-04 LAB — COMPLETE METABOLIC PANEL WITH GFR
AG Ratio: 1.5 (calc) (ref 1.0–2.5)
ALT: 18 U/L (ref 9–46)
AST: 17 U/L (ref 10–35)
Albumin: 4.3 g/dL (ref 3.6–5.1)
Alkaline phosphatase (APISO): 95 U/L (ref 35–144)
BUN: 13 mg/dL (ref 7–25)
CO2: 24 mmol/L (ref 20–32)
Calcium: 9.8 mg/dL (ref 8.6–10.3)
Chloride: 103 mmol/L (ref 98–110)
Creat: 0.81 mg/dL (ref 0.70–1.25)
GFR, Est African American: 105 mL/min/{1.73_m2} (ref >=60)
GFR, Est Non African American: 91 mL/min/{1.73_m2} (ref >=60)
Globulin: 2.9 g/dL (calc) (ref 1.9–3.7)
Glucose, Bld: 95 mg/dL (ref 65–99)
Potassium: 4.6 mmol/L (ref 3.5–5.3)
Sodium: 139 mmol/L (ref 135–146)
Total Bilirubin: 0.3 mg/dL (ref 0.2–1.2)
Total Protein: 7.2 g/dL (ref 6.1–8.1)

## 2020-05-04 LAB — CBC WITH DIFFERENTIAL/PLATELET
Absolute Monocytes: 681 cells/uL (ref 200–950)
Basophils Absolute: 49 cells/uL (ref 0–200)
Basophils Relative: 0.6 %
Eosinophils Absolute: 90 cells/uL (ref 15–500)
Eosinophils Relative: 1.1 %
HCT: 42.6 % (ref 38.5–50.0)
Hemoglobin: 14.4 g/dL (ref 13.2–17.1)
Lymphs Abs: 2485 cells/uL (ref 850–3900)
MCH: 30.7 pg (ref 27.0–33.0)
MCHC: 33.8 g/dL (ref 32.0–36.0)
MCV: 90.8 fL (ref 80.0–100.0)
MPV: 11.1 fL (ref 7.5–12.5)
Monocytes Relative: 8.3 %
Neutro Abs: 4895 cells/uL (ref 1500–7800)
Neutrophils Relative %: 59.7 %
Platelets: 264 10*3/uL (ref 140–400)
RBC: 4.69 10*6/uL (ref 4.20–5.80)
RDW: 12.1 % (ref 11.0–15.0)
Total Lymphocyte: 30.3 %
WBC: 8.2 10*3/uL (ref 3.8–10.8)

## 2020-05-04 LAB — URINE CULTURE
MICRO NUMBER:: 11269002
Result:: NO GROWTH
SPECIMEN QUALITY:: ADEQUATE

## 2020-05-04 NOTE — Telephone Encounter (Signed)
Covid Vaccine  Dose 1- 8/27 Dose 2- 9/18

## 2020-05-04 NOTE — Telephone Encounter (Signed)
Called and lm for pt tcb, when he does please get which brand he got.

## 2020-05-08 NOTE — Telephone Encounter (Signed)
Vaccines added

## 2020-05-08 NOTE — Telephone Encounter (Signed)
Pfizer

## 2020-06-11 MED FILL — DULoxetine HCL 60 MG CPEP: 60 | 90 days supply | Qty: 90 | Fill #1

## 2020-07-04 MED FILL — GABAPENTIN 300 MG CAPSULE: 300 | 90 days supply | Qty: 540 | Fill #2

## 2020-08-06 ENCOUNTER — Ambulatory Visit (INDEPENDENT_AMBULATORY_CARE_PROVIDER_SITE_OTHER): Payer: PPO

## 2020-08-06 ENCOUNTER — Other Ambulatory Visit: Payer: Self-pay

## 2020-08-06 DIAGNOSIS — Z0000 Encounter for general adult medical examination without abnormal findings: Secondary | ICD-10-CM

## 2020-08-06 NOTE — Progress Notes (Signed)
Virtual Visit via Telephone Note  I connected with  Charles Boesen Sr. on 08/06/20 at  3:15 PM EST by telephone and verified that I am speaking with the correct person using two identifiers.  Medicare Annual Wellness visit completed telephonically due to Covid-19 pandemic.   Persons participating in this call: This Health Coach and this patient.   Location: Patient: Home Provider: Office   I discussed the limitations, risks, security and privacy concerns of performing an evaluation and management service by telephone and the availability of in person appointments. The patient expressed understanding and agreed to proceed.  Unable to perform video visit due to video visit attempted and failed and/or patient does not have video capability.   Some vital signs may be absent or patient reported.   Willette Brace, LPN   Subjective:   Charles Harrington. is a 70 y.o. male who presents for Medicare Annual/Subsequent preventive examination.  Review of Systems     Cardiac Risk Factors include: advanced age (>26men, >34 women);dyslipidemia;male gender     Objective:    Today's Vitals   08/06/20 1517  PainSc: 7    There is no height or weight on file to calculate BMI.  Advanced Directives 08/06/2020 11/25/2019 07/26/2019 01/29/2018 08/27/2011 08/26/2011  Does Patient Have a Medical Advance Directive? Yes No Yes Yes Patient has advance directive, copy not in chart Patient has advance directive, copy not in chart  Type of Advance Directive Healthcare Power of [place];Living will [place];Living will  Does patient want to make changes to medical advance directive? - - No - Patient declined No - Patient declined - -  Copy of [place] in Chart? No - copy requested - No - copy requested No - copy requested Copy requested from family Copy  requested from family  Pre-existing out of facility DNR order (yellow form or pink MOST form) - - - - No -    Current Medications (verified) Outpatient Encounter Medications as of 08/06/2020  Medication Sig  . acetaminophen (TYLENOL 8 HOUR) 650 MG CR tablet Take 1 tablet (650 mg total) by mouth every 8 (eight) hours as needed for pain.  Marland Kitchen atorvastatin (LIPITOR) 40 MG tablet Take 1 tablet (40 mg total) by mouth daily.  . DULoxetine (CYMBALTA) 60 MG capsule TAKE 1 CAPSULE (60 MG TOTAL) BY MOUTH DAILY.  Marland Kitchen gabapentin (NEURONTIN) 300 MG capsule Take 3 capsules (900 mg total) by mouth 2 (two) times daily.  Marland Kitchen ibuprofen (ADVIL) 200 MG tablet Take 200 mg by mouth every 6 (six) hours as needed.  . latanoprost (XALATAN) 0.005 % ophthalmic solution SMARTSIG:1 Drop(s) In Eye(s) Every Evening  . sildenafil (REVATIO) 20 MG tablet Take 2-5 tablets as needed once every 48 hours for erectile dysfunction   No facility-administered encounter medications on file as of 08/06/2020.    Allergies (verified) Codeine   History: Past Medical History:  Diagnosis Date  . Asthma    albuterol once every other week, likely cat allergy  . Chronic back pain    mild after back surgery L4 and L5 ruptured disc with surgery 2013  . GERD (gastroesophageal reflux disease)    omeprazole prn  . Hyperlipidemia    no rx  . Neuropathy    gabapentin 300mg  BID   Past Surgical History:  Procedure Laterality Date  . CARPAL TUNNEL RELEASE Right   . Colonscopy    .  LUMBAR LAMINECTOMY/DECOMPRESSION MICRODISCECTOMY  08/27/2011   Procedure: LUMBAR LAMINECTOMY/DECOMPRESSION MICRODISCECTOMY 2 LEVELS;  Surgeon: Floyce Stakes, MD;  Location: [facility];  Service: Neurosurgery;  Laterality: Left;  Left Lumbar four- five, five-sacral one Diskectomy   Family History  Problem Relation Age of Onset  . Hypercholesterolemia Mother        father, sisters, brothers  . Stroke Mother        late 44s, early 57s  . Diabetes Mother   .  Congestive Heart Failure Mother   . Heart disease Sister        CABG sister 84- heavy smoker   . Hypercholesterolemia Sister   . Hypercholesterolemia Father   . Hypercholesterolemia Brother   . Hypercholesterolemia Sister   . Hypercholesterolemia Sister   . Hypercholesterolemia Sister   . Early death Sister   . Hydrocephalus Sister   . Hypercholesterolemia Sister   . Hypercholesterolemia Brother   . Pancreatic cancer Brother   . Cancer Brother   . Hypercholesterolemia Brother   . Hypercholesterolemia Brother   . Anesthesia problems Neg Hx    Social History   Socioeconomic History  . Marital status: Married    Spouse name: Not on file  . Number of children: Not on file  . Years of education: Not on file  . Highest education level: Not on file  Occupational History  . Not on file  Tobacco Use  . Smoking status: Never Smoker  . Smokeless tobacco: Never Used  Vaping Use  . Vaping Use: Never used  Substance and Sexual Activity  . Alcohol use: Yes    Alcohol/week: 0.0 standard drinks    Comment: rarely 2 beers a year  . Drug use: No  . Sexual activity: Not on file  Other Topics Concern  . Not on file  Social History Narrative   Family: Married 16 years in 2016. No children. 1 dog- pit bull and lab mix. Lives wife.    2nd youngest of 79      Work: Magazine features editor for [employer]   HS degree      Hobbies: rest, deer and rabbit hunt   Social Determinants of Health   Financial Resource Strain: Low Risk   . Difficulty of Paying Living Expenses: Not hard at all  Food Insecurity: No Food Insecurity  . Worried About Charity fundraiser in the Last Year: Never true  . Ran Out of Food in the Last Year: Never true  Transportation Needs: No Transportation Needs  . Lack of Transportation (Medical): No  . Lack of Transportation (Non-Medical): No  Physical Activity: Sufficiently Active  . Days of Exercise per Week: 7 days  . Minutes of Exercise per Session: 30 min   Stress: No Stress Concern Present  . Feeling of Stress : Not at all  Social Connections: Socially Isolated  . Frequency of Communication with Friends and Family: Once a week  . Frequency of Social Gatherings with Friends and Family: Once a week  . Attends Religious Services: Never  . Active Member of Clubs or Organizations: No  . Attends Archivist Meetings: Never  . Marital Status: Married    Tobacco Counseling Counseling given: Not Answered   Clinical Intake:  Pre-visit preparation completed: Yes  Pain : 0-10 Pain Score: 7  Pain Type: Neuropathic pain Pain Location: Generalized Pain Descriptors / Indicators: Sore Pain Onset: More than a month ago Pain Frequency: Intermittent     BMI - recorded: 29.78 Nutritional Risks:  None Diabetes: No  How often do you need to have someone help you when you read instructions, pamphlets, or other written materials from your doctor or pharmacy?: 1 - Never  Diabetic?No  Interpreter Needed?: No  Information entered by :: Charlott Rakes, LPN   Activities of Daily Living In your present state of health, do you have any difficulty performing the following activities: 08/06/2020  Hearing? N  Vision? N  Difficulty concentrating or making decisions? N  Walking or climbing stairs? N  Dressing or bathing? N  Doing errands, shopping? N  Preparing Food and eating ? N  Using the Toilet? N  In the past six months, have you accidently leaked urine? N  Do you have problems with loss of bowel control? N  Managing your Medications? N  Managing your Finances? N  Housekeeping or managing your Housekeeping? N  Some recent data might be hidden    Patient Care Team: Marin Olp, MD as PCP - General (Family Medicine) Danella Sensing, MD as Consulting Physician (Dermatology)  Indicate any recent Medical Services you may have received from other than [facility] providers in the past year (date may be approximate).     Assessment:    This is a routine wellness examination for Charles Harrington.  Hearing/Vision screen  Hearing Screening   125Hz  250Hz  500Hz  1000Hz  2000Hz  3000Hz  4000Hz  6000Hz  8000Hz   Right ear:           Left ear:           Comments: Pt denies any hearing issues   Vision Screening Comments: Pt follows [facility] with [facility] for annual exams   Dietary issues and exercise activities discussed: Current Exercise Habits: Home exercise routine, Type of exercise: walking, Time (Minutes): 30, Frequency (Times/Week): 7, Weekly Exercise (Minutes/Week): 210  Goals    . DIET - INCREASE WATER INTAKE     Drink more water, less Gatorade    . Patient Stated     None at this time      Depression Screen PHQ 2/9 Scores 08/06/2020 07/26/2019 03/28/2019 01/29/2018 10/23/2016 10/15/2015  PHQ - 2 Score 0 0 0 0 0 0    Fall Risk Fall Risk  08/06/2020 07/26/2019 01/29/2018 10/23/2016 10/15/2015  Falls in the past year? 0 0 No No No  Number falls in past yr: 0 0 - - -  Injury with Fall? 0 0 - - -  Risk for fall due to : Impaired vision - - - -  Follow up Falls prevention discussed Falls evaluation completed;Education provided;Falls prevention discussed - - -    FALL RISK PREVENTION PERTAINING TO THE HOME:  Any stairs in or around the home? Yes  If so, are there any without handrails? No  Home free of loose throw rugs in walkways, pet beds, electrical cords, etc? Yes  Adequate lighting in your home to reduce risk of falls? Yes   ASSISTIVE DEVICES UTILIZED TO PREVENT FALLS:  Life alert? No  Use of a cane, walker or w/c? No  Grab bars in the bathroom? Yes  Shower chair or bench in shower? Yes  Elevated toilet seat or a handicapped toilet? Yes   TIMED UP AND GO:  Was the test performed? No .     Cognitive Function:     6CIT Screen 08/06/2020 07/26/2019 01/29/2018  What Year? 0 points 0 points 0 points  What month? 0 points 0 points 0 points  What time? - 0 points 0 points  Count back from 20  0 points 0 points 0 points  Months  in reverse 4 points 0 points 0 points  Repeat phrase 4 points 0 points -  Total Score - 0 -    Immunizations Immunization History  Administered Date(s) Administered  . Influenza,inj,Quad PF,6+ Mos 02/14/2015, 04/11/2016  . Influenza-Unspecified 05/04/2017, 04/06/2018, 03/17/2019  . PFIZER(Purple Top)SARS-COV-2 Vaccination 01/27/2020, 02/18/2020  . Pneumococcal Conjugate-13 10/22/2017  . Pneumococcal Polysaccharide-23 03/28/2019  . Tdap 10/15/2015    TDAP status: Up to date  Flu Vaccine status: Due, Education has been provided regarding the importance of this vaccine. Advised may receive this vaccine at local pharmacy or Health Dept. Aware to provide a copy of the vaccination record if obtained from local pharmacy or Health Dept. Verbalized acceptance and understanding.  Pneumococcal vaccine status: Up to date  Covid-19 vaccine status: Completed vaccines  Booster recommened  Qualifies for Shingles Vaccine? Yes   Zostavax completed No   Shingrix Completed?: No.    Education has been provided regarding the importance of this vaccine. Patient has been advised to call insurance company to determine out of pocket expense if they have not yet received this vaccine. Advised may also receive vaccine at local pharmacy or Health Dept. Verbalized acceptance and understanding.  Screening Tests Health Maintenance  Topic Date Due  . INFLUENZA VACCINE  08/30/2020 (Originally 01/01/2020)  . COVID-19 Vaccine (3 - Booster for Pfizer series) 08/17/2020  . Fecal DNA (Cologuard)  11/12/2020  . TETANUS/TDAP  10/14/2025  . Hepatitis C Screening  Completed  . PNA vac Low Risk Adult  Completed  . HPV VACCINES  Aged Out    Health Maintenance  There are no preventive care reminders to display for this patient.  Colorectal cancer screening: Type of screening: Cologuard. Completed 11/12/17. Repeat every 3 years   Additional Screening:  Hepatitis C Screening:  Completed 10/22/17  Vision Screening:  Recommended annual ophthalmology exams for early detection of glaucoma and other disorders of the eye. Is the patient up to date with their annual eye exam?  Yes  Who is the provider or what is the name of the office in which the patient attends annual eye exams? [facility]  If pt is not established with a provider, would they like to be referred to a provider to establish care? No .   Dental Screening: Recommended annual dental exams for proper oral hygiene  Community Resource Referral / Chronic Care Management: CRR required this visit?  No   CCM required this visit?  No      Plan:     I have personally reviewed and noted the following in the patient's chart:   . Medical and social history . Use of alcohol, tobacco or illicit drugs  . Current medications and supplements . Functional ability and status . Nutritional status . Physical activity . Advanced directives . List of other physicians . Hospitalizations, surgeries, and ER visits in previous 12 months . Vitals . Screenings to include cognitive, depression, and falls . Referrals and appointments  In addition, I have reviewed and discussed with patient certain preventive protocols, quality metrics, and best practice recommendations. A written personalized care plan for preventive services as well as general preventive health recommendations were provided to patient.     Willette Brace, LPN   [id number]/11/3708   Nurse Notes: None

## 2020-08-06 NOTE — Patient Instructions (Signed)
Charles Harrington , Thank you for taking time to come for your Medicare Wellness Visit. I appreciate your ongoing commitment to your health goals. Please review the following plan we discussed and let me know if I can assist you in the future.   Screening recommendations/referrals: Colonoscopy: Cologuard 11/12/17 Recommended yearly ophthalmology/optometry visit for glaucoma screening and checkup Recommended yearly dental visit for hygiene and checkup  Vaccinations: Influenza vaccine: Due and discussed Pneumococcal vaccine: Up to date Tdap vaccine: Up to date Shingles vaccine: Shingrix discussed. Please contact your pharmacy for coverage information.    Covid-19: Completed 8/27 & 02/18/20  Advanced directives: Please bring a copy of your health care power of attorney and living will to the office at your convenience.  Conditions/risks identified: None at this time  Next appointment: Follow up in one year for your annual wellness visit.   Preventive Care 31 Years and Older, Male Preventive care refers to lifestyle choices and visits with your health care provider that can promote health and wellness. What does preventive care include?  A yearly physical exam. This is also called an annual well check.  Dental exams once or twice a year.  Routine eye exams. Ask your health care provider how often you should have your eyes checked.  Personal lifestyle choices, including:  Daily care of your teeth and gums.  Regular physical activity.  Eating a healthy diet.  Avoiding tobacco and drug use.  Limiting alcohol use.  Practicing safe sex.  Taking low doses of aspirin every day.  Taking vitamin and mineral supplements as recommended by your health care provider. What happens during an annual well check? The services and screenings done by your health care provider during your annual well check will depend on your age, overall health, lifestyle risk factors, and family history of  disease. Counseling  Your health care provider may ask you questions about your:  Alcohol use.  Tobacco use.  Drug use.  Emotional well-being.  Home and relationship well-being.  Sexual activity.  Eating habits.  History of falls.  Memory and ability to understand (cognition).  Work and work Statistician. Screening  You may have the following tests or measurements:  Height, weight, and BMI.  Blood pressure.  Lipid and cholesterol levels. These may be checked every 5 years, or more frequently if you are over 27 years old.  Skin check.  Lung cancer screening. You may have this screening every year starting at age 67 if you have a 30-pack-year history of smoking and currently smoke or have quit within the past 15 years.  Fecal occult blood test (FOBT) of the stool. You may have this test every year starting at age 41.  Flexible sigmoidoscopy or colonoscopy. You may have a sigmoidoscopy every 5 years or a colonoscopy every 10 years starting at age 50.  Prostate cancer screening. Recommendations will vary depending on your family history and other risks.  Hepatitis C blood test.  Hepatitis B blood test.  Sexually transmitted disease (STD) testing.  Diabetes screening. This is done by checking your blood sugar (glucose) after you have not eaten for a while (fasting). You may have this done every 1-3 years.  Abdominal aortic aneurysm (AAA) screening. You may need this if you are a current or former smoker.  Osteoporosis. You may be screened starting at age 47 if you are at high risk. Talk with your health care provider about your test results, treatment options, and if necessary, the need for more tests. Vaccines  Your  health care provider may recommend certain vaccines, such as:  Influenza vaccine. This is recommended every year.  Tetanus, diphtheria, and acellular pertussis (Tdap, Td) vaccine. You may need a Td booster every 10 years.  Zoster vaccine. You may  need this after age 61.  Pneumococcal 13-valent conjugate (PCV13) vaccine. One dose is recommended after age 47.  Pneumococcal polysaccharide (PPSV23) vaccine. One dose is recommended after age 73. Talk to your health care provider about which screenings and vaccines you need and how often you need them. This information is not intended to replace advice given to you by your health care provider. Make sure you discuss any questions you have with your health care provider. Document Released: 06/15/2015 Document Revised: 02/06/2016 Document Reviewed: 03/20/2015 Elsevier Interactive Patient Education  2017 [place] Prevention in the Home Falls can cause injuries. They can happen to people of all ages. There are many things you can do to make your home safe and to help prevent falls. What can I do on the outside of my home?  Regularly fix the edges of walkways and driveways and fix any cracks.  Remove anything that might make you trip as you walk through a door, such as a raised step or threshold.  Trim any bushes or trees on the path to your home.  Use bright outdoor lighting.  Clear any walking paths of anything that might make someone trip, such as rocks or tools.  Regularly check to see if handrails are loose or broken. Make sure that both sides of any steps have handrails.  Any raised decks and porches should have guardrails on the edges.  Have any leaves, snow, or ice cleared regularly.  Use sand or salt on walking paths during winter.  Clean up any spills in your garage right away. This includes oil or grease spills. What can I do in the bathroom?  Use night lights.  Install grab bars by the toilet and in the tub and shower. Do not use towel bars as grab bars.  Use non-skid mats or decals in the tub or shower.  If you need to sit down in the shower, use a plastic, non-slip stool.  Keep the floor dry. Clean up any water that spills on the floor as soon as it  happens.  Remove soap buildup in the tub or shower regularly.  Attach bath mats securely with double-sided non-slip rug tape.  Do not have throw rugs and other things on the floor that can make you trip. What can I do in the bedroom?  Use night lights.  Make sure that you have a light by your bed that is easy to reach.  Do not use any sheets or blankets that are too big for your bed. They should not hang down onto the floor.  Have a firm chair that has side arms. You can use this for support while you get dressed.  Do not have throw rugs and other things on the floor that can make you trip. What can I do in the kitchen?  Clean up any spills right away.  Avoid walking on wet floors.  Keep items that you use a lot in easy-to-reach places.  If you need to reach something above you, use a strong step stool that has a grab bar.  Keep electrical cords out of the way.  Do not use floor polish or wax that makes floors slippery. If you must use wax, use non-skid floor wax.  Do not  have throw rugs and other things on the floor that can make you trip. What can I do with my stairs?  Do not leave any items on the stairs.  Make sure that there are handrails on both sides of the stairs and use them. Fix handrails that are broken or loose. Make sure that handrails are as long as the stairways.  Check any carpeting to make sure that it is firmly attached to the stairs. Fix any carpet that is loose or worn.  Avoid having throw rugs at the top or bottom of the stairs. If you do have throw rugs, attach them to the floor with carpet tape.  Make sure that you have a light switch at the top of the stairs and the bottom of the stairs. If you do not have them, ask someone to add them for you. What else can I do to help prevent falls?  Wear shoes that:  Do not have high heels.  Have rubber bottoms.  Are comfortable and fit you well.  Are closed at the toe. Do not wear sandals.  If you  use a stepladder:  Make sure that it is fully opened. Do not climb a closed stepladder.  Make sure that both sides of the stepladder are locked into place.  Ask someone to hold it for you, if possible.  Clearly mark and make sure that you can see:  Any grab bars or handrails.  First and last steps.  Where the edge of each step is.  Use tools that help you move around (mobility aids) if they are needed. These include:  Canes.  Walkers.  Scooters.  Crutches.  Turn on the lights when you go into a dark area. Replace any light bulbs as soon as they burn out.  Set up your furniture so you have a clear path. Avoid moving your furniture around.  If any of your floors are uneven, fix them.  If there are any pets around you, be aware of where they are.  Review your medicines with your doctor. Some medicines can make you feel dizzy. This can increase your chance of falling. Ask your doctor what other things that you can do to help prevent falls. This information is not intended to replace advice given to you by your health care provider. Make sure you discuss any questions you have with your health care provider. Document Released: 03/15/2009 Document Revised: 10/25/2015 Document Reviewed: 06/23/2014 Elsevier Interactive Patient Education  2017 [employer].

## 2020-09-10 ENCOUNTER — Other Ambulatory Visit: Payer: Self-pay | Admitting: Family Medicine

## 2020-09-10 MED FILL — Duloxetine HCl Enteric Coated Pellets Cap 60 MG (Base Eq): ORAL | 90 days supply | Qty: 90 | Fill #0 | Status: AC

## 2020-09-11 ENCOUNTER — Other Ambulatory Visit (HOSPITAL_COMMUNITY): Payer: Self-pay

## 2020-09-11 MED ORDER — GABAPENTIN 300 MG PO CAPS
900.0000 mg | ORAL_CAPSULE | Freq: Two times a day (BID) | ORAL | 2 refills | Status: DC
  Filled 2020-09-11 – 2020-09-30 (×3): qty 540, 90d supply, fill #0
  Filled 2020-12-29: qty 540, 90d supply, fill #1

## 2020-09-13 ENCOUNTER — Other Ambulatory Visit (HOSPITAL_COMMUNITY): Payer: Self-pay

## 2020-10-01 ENCOUNTER — Other Ambulatory Visit (HOSPITAL_COMMUNITY): Payer: Self-pay

## 2020-12-16 ENCOUNTER — Other Ambulatory Visit: Payer: Self-pay | Admitting: Family Medicine

## 2020-12-17 ENCOUNTER — Other Ambulatory Visit (HOSPITAL_COMMUNITY): Payer: Self-pay

## 2020-12-17 MED ORDER — DULOXETINE HCL 60 MG PO CPEP
60.0000 mg | ORAL_CAPSULE | Freq: Every day | ORAL | 2 refills | Status: DC
  Filled 2020-12-17: qty 90, 90d supply, fill #0

## 2020-12-18 ENCOUNTER — Other Ambulatory Visit (HOSPITAL_COMMUNITY): Payer: Self-pay

## 2020-12-31 ENCOUNTER — Other Ambulatory Visit (HOSPITAL_COMMUNITY): Payer: Self-pay

## 2021-01-28 NOTE — Progress Notes (Addendum)
Phone: [phone]   Subjective:  Patient presents today for their annual physical. Chief complaint-noted.   See problem oriented charting- ROS- full  review of systems was completed and negative  except for: joint pain, neck stiffness, neck pain, near sighted- thinks needs glasses adjusted - having some close up issues  The following were reviewed and entered/updated in epic: Past Medical History:  Diagnosis Date   Asthma    albuterol once every other week, likely cat allergy   Chronic back pain    mild after back surgery L4 and L5 ruptured disc with surgery 2013   GERD (gastroesophageal reflux disease)    omeprazole prn   Hyperlipidemia    no rx   Neuropathy    gabapentin '300mg'$  BID   Patient Active Problem List   Diagnosis Date Noted   Carpal tunnel syndrome 02/14/2015    Priority: Medium   Hereditary and idiopathic peripheral neuropathy 02/14/2015    Priority: Medium   Hyperglycemia 02/14/2015    Priority: Medium   Asthma     Priority: Medium   Hyperlipidemia     Priority: Medium   Hearing loss 02/14/2015    Priority: Low   GERD (gastroesophageal reflux disease)     Priority: Low   Chronic back pain     Priority: Low   B12 deficiency 03/28/2019   Erectile dysfunction [id number]   Lichen planus [id number]   Past Surgical History:  Procedure Laterality Date   CARPAL TUNNEL RELEASE Right    Colonscopy     LUMBAR LAMINECTOMY/DECOMPRESSION MICRODISCECTOMY  08/27/2011   Procedure: LUMBAR LAMINECTOMY/DECOMPRESSION MICRODISCECTOMY 2 LEVELS;  Surgeon: Floyce Stakes, MD;  Location: [facility];  Service: Neurosurgery;  Laterality: Left;  Left Lumbar four- five, five-sacral one Diskectomy    Family History  Problem Relation Age of Onset   Hypercholesterolemia Mother        father, sisters, brothers   Stroke Mother        late 74s, early 66s   Diabetes Mother    Congestive Heart Failure Mother    Heart disease Sister        CABG sister 64- heavy smoker     Hypercholesterolemia Sister    Hypercholesterolemia Father    Hypercholesterolemia Brother    Hypercholesterolemia Sister    Hypercholesterolemia Sister    Hypercholesterolemia Sister    Early death Sister    Hydrocephalus Sister    Hypercholesterolemia Sister    Hypercholesterolemia Brother    Pancreatic cancer Brother    Cancer Brother    Hypercholesterolemia Brother    Hypercholesterolemia Brother    Anesthesia problems Neg Hx     Medications- reviewed and updated Current Outpatient Medications  Medication Sig Dispense Refill   acetaminophen (TYLENOL 8 HOUR) 650 MG CR tablet Take 1 tablet (650 mg total) by mouth every 8 (eight) hours as needed for pain. (Patient not taking: Reported on 02/01/2021) 15 tablet 0   atorvastatin (LIPITOR) 40 MG tablet Take 1 tablet (40 mg total) by mouth daily. 90 tablet 3   DULoxetine (CYMBALTA) 60 MG capsule Take 1 capsule (60 mg total) by mouth daily. 90 capsule 3   gabapentin (NEURONTIN) 300 MG capsule TAKE 3 CAPSULES (900 MG TOTAL) BY MOUTH 2 (TWO) TIMES DAILY. 540 capsule 3   ibuprofen (ADVIL) 200 MG tablet Take 200 mg by mouth every 6 (six) hours as needed. (Patient not taking: Reported on 02/01/2021)     latanoprost (XALATAN) 0.005 % ophthalmic solution SMARTSIG:1 Drop(s) In Eye(s) Every Evening (  Patient not taking: Reported on 02/01/2021)     sildenafil (REVATIO) 20 MG tablet Take 2-5 tablets as needed once every 48 hours for erectile dysfunction 50 tablet 3   No current facility-administered medications for this visit.    Allergies-reviewed and updated Allergies  Allergen Reactions   Codeine Nausea And Vomiting    Social History   Social History Narrative   Family: Married 16 years in 2016. No children. 1 dog- pit bull and lab mix. Lives wife.    2nd youngest of 21      Work: Magazine features editor for [employer]   HS degree      Hobbies: rest, deer and rabbit hunt   Objective  Objective:  BP (!) 157/70   Pulse 73   Temp 98.5  F (36.9 C) (Temporal)   Ht 6' (1.829 m)   Wt 218 lb 6.4 oz (99.1 kg)   SpO2 97%   BMI 29.62 kg/m  Gen: NAD, resting comfortably HEENT: Mucous membranes are moist. Oropharynx normal Neck: no thyromegaly CV: RRR no murmurs rubs or gallops Lungs: CTAB no crackles, wheeze, rhonchi Abdomen: soft/nontender/nondistended/normal bowel sounds. No rebound or guarding.  Ext: minimal edema Skin: warm, dry Neuro: grossly normal, moves all extremities, PERRLA    Assessment and Plan  70 y.o. male presenting for annual physical.  Health Maintenance counseling: 1. Anticipatory guidance: Patient counseled regarding regular dental exams -q6 months, eye exams -yearly,  avoiding smoking and second hand smoke , limiting alcohol to 2 beverages per day - does not drink, no illicit drugs.   2. Risk factor reduction:  Advised patient of need for regular exercise and diet rich and fruits and vegetables to reduce risk of heart attack and stroke. Exercise- active at the home- walks regularly. Diet-trying to improve on diet.  Wt Readings from Last 3 Encounters:  02/01/21 218 lb 6.4 oz (99.1 kg)  05/03/20 219 lb 9.6 oz (99.6 kg)  11/25/19 220 lb (99.8 kg)  3. Immunizations/screenings/ancillary studies- shingles at pharmacy recommended, prevnar 20 discussed- opts out for now Immunization History  Administered Date(s) Administered   Influenza,inj,Quad PF,6+ Mos 02/14/2015, 04/11/2016   Influenza-Unspecified 05/04/2017, 04/06/2018, 03/17/2019   PFIZER(Purple Top)SARS-COV-2 Vaccination 01/27/2020, 02/18/2020   Pneumococcal Conjugate-13 10/22/2017   Pneumococcal Polysaccharide-23 03/28/2019   Tdap 10/15/2015  4. Prostate cancer screening- trend PSA for final check as long as stable  Lab Results  Component Value Date   PSA 1.31 10/08/2015   PSA 1.58 01/24/2013   5. Colon cancer screening - opts for cologuard- now due 6. Skin cancer screening- no dermatologist recently. advised regular sunscreen use. Denies  worrisome, changing, or new skin lesions.  7. Never smoker 8. STD screening - opts out as monogamous   Status of chronic or acute concerns   #Right groin pain/right lower abdominal pain-thankfully work-up including testicular ultrasound was reassuring at last visit.  He reports pain has resolved-he will let us know if recurrent issues  # Neuropathy S:complaint with Cymbalta '60mg'$  daily  and gabapentin twice daily. Pain up to his knees and feels in hands as well.    Had seen neuro years ago per his report and not particularly helpful A/P: worsening over time- at least gets some improvement with cymbalta and gabapentin combo- continue this.  - declines neuro follow up for now  #hyperlipidemia S: Medication:complaint with Atorvastatin 40 mg daily  in the past- has run out-  Lab Results  Component Value Date   CHOL 264 (H) 08/18/2018   HDL  38.00 (L) 08/18/2018   LDLCALC 172 (H) 10/08/2015   LDLDIRECT 116.0 03/28/2019   TRIG 221.0 (H) 08/18/2018   CHOLHDL 7 08/18/2018   A/P: we will get new baseline - suspect poor control and likely restart rx- went ahead and sent this in today  # Hyperglycemia/insulin resistance/prediabetes S:  Medication: none-continued excellent lifestyle improvements Exercise and diet- staying active- weight down slightly. Trying to eat reasonably healthy diet Lab Results  Component Value Date   HGBA1C 5.7 03/28/2019   HGBA1C 6.0 08/18/2018   HGBA1C 5.8 (H) 07/10/2017   A/P: hopefully stable- update a1c with labs- continue to work on diet/exercise.   #HM- opts for cologuard at home  Recommended follow up: No follow-ups on file. Future Appointments  Date Time Provider [place]  08/09/2021  3:15 PM [facility]   Lab/Order associations: coffee and grits today   ICD-10-CM   1. Hyperglycemia  R73.9 Hemoglobin A1c    2. Hyperlipidemia, unspecified hyperlipidemia type  E78.5 CBC with Differential/Platelet    Comprehensive  metabolic panel    Lipid panel    3. Right groin pain  R10.31     4. Screen for colon cancer  Z12.11 Cologuard    5. Hereditary and idiopathic peripheral neuropathy  G60.9       Meds ordered this encounter  Medications   atorvastatin (LIPITOR) 40 MG tablet    Sig: Take 1 tablet (40 mg total) by mouth daily.    Dispense:  90 tablet    Refill:  3   DULoxetine (CYMBALTA) 60 MG capsule    Sig: Take 1 capsule (60 mg total) by mouth daily.    Dispense:  90 capsule    Refill:  3   gabapentin (NEURONTIN) 300 MG capsule    Sig: TAKE 3 CAPSULES (900 MG TOTAL) BY MOUTH 2 (TWO) TIMES DAILY.    Dispense:  540 capsule    Refill:  3   sildenafil (REVATIO) 20 MG tablet    Sig: Take 2-5 tablets as needed once every 48 hours for erectile dysfunction    Dispense:  50 tablet    Refill:  3     I,Jada Bradford,acting as a scribe for Garret Reddish, MD.,have documented all relevant documentation on the behalf of Garret Reddish, MD,as directed by  Garret Reddish, MD while in the presence of Garret Reddish, MD.  I, Garret Reddish, MD, have reviewed all documentation for this visit. The documentation on 02/01/21 for the exam, diagnosis, procedures, and orders are all accurate and complete.  Return precautions advised.  Garret Reddish, MD

## 2021-02-01 ENCOUNTER — Ambulatory Visit (INDEPENDENT_AMBULATORY_CARE_PROVIDER_SITE_OTHER): Payer: PPO | Admitting: Family Medicine

## 2021-02-01 ENCOUNTER — Encounter: Payer: Self-pay | Admitting: Family Medicine

## 2021-02-01 ENCOUNTER — Other Ambulatory Visit: Payer: Self-pay

## 2021-02-01 ENCOUNTER — Other Ambulatory Visit (HOSPITAL_COMMUNITY): Payer: Self-pay

## 2021-02-01 VITALS — BP 157/70 | HR 73 | Temp 98.5°F | Ht 72.0 in | Wt 218.4 lb

## 2021-02-01 DIAGNOSIS — G609 Hereditary and idiopathic neuropathy, unspecified: Secondary | ICD-10-CM

## 2021-02-01 DIAGNOSIS — Z0000 Encounter for general adult medical examination without abnormal findings: Secondary | ICD-10-CM

## 2021-02-01 DIAGNOSIS — Z1211 Encounter for screening for malignant neoplasm of colon: Secondary | ICD-10-CM | POA: Diagnosis not present

## 2021-02-01 DIAGNOSIS — E785 Hyperlipidemia, unspecified: Secondary | ICD-10-CM | POA: Diagnosis not present

## 2021-02-01 DIAGNOSIS — R1031 Right lower quadrant pain: Secondary | ICD-10-CM | POA: Diagnosis not present

## 2021-02-01 DIAGNOSIS — R739 Hyperglycemia, unspecified: Secondary | ICD-10-CM

## 2021-02-01 DIAGNOSIS — R351 Nocturia: Secondary | ICD-10-CM | POA: Diagnosis not present

## 2021-02-01 LAB — COMPREHENSIVE METABOLIC PANEL
ALT: 17 U/L (ref 0–53)
AST: 16 U/L (ref 0–37)
Albumin: 4.3 g/dL (ref 3.5–5.2)
Alkaline Phosphatase: 95 U/L (ref 39–117)
BUN: 15 mg/dL (ref 6–23)
CO2: 30 mEq/L (ref 19–32)
Calcium: 10 mg/dL (ref 8.4–10.5)
Chloride: 102 mEq/L (ref 96–112)
Creatinine, Ser: 0.86 mg/dL (ref 0.40–1.50)
GFR: 87.96 mL/min (ref >60.00)
Glucose, Bld: 99 mg/dL (ref 70–99)
Potassium: 4.4 mEq/L (ref 3.5–5.1)
Sodium: 139 mEq/L (ref 135–145)
Total Bilirubin: 0.4 mg/dL (ref 0.2–1.2)
Total Protein: 7.3 g/dL (ref 6.0–8.3)

## 2021-02-01 LAB — HEMOGLOBIN A1C: Hgb A1c MFr Bld: 5.9 % (ref 4.6–6.5)

## 2021-02-01 LAB — CBC WITH DIFFERENTIAL/PLATELET
Basophils Absolute: 0 10*3/uL (ref 0.0–0.1)
Basophils Relative: 0.5 % (ref 0.0–3.0)
Eosinophils Absolute: 0.1 10*3/uL (ref 0.0–0.7)
Eosinophils Relative: 1.3 % (ref 0.0–5.0)
HCT: 43.5 % (ref 39.0–52.0)
Hemoglobin: 14.3 g/dL (ref 13.0–17.0)
Lymphocytes Relative: 35.4 % (ref 12.0–46.0)
Lymphs Abs: 2.3 10*3/uL (ref 0.7–4.0)
MCHC: 32.8 g/dL (ref 30.0–36.0)
MCV: 92.4 fl (ref 78.0–100.0)
Monocytes Absolute: 0.5 10*3/uL (ref 0.1–1.0)
Monocytes Relative: 7.7 % (ref 3.0–12.0)
Neutro Abs: 3.6 10*3/uL (ref 1.4–7.7)
Neutrophils Relative %: 55.1 % (ref 43.0–77.0)
Platelets: 214 10*3/uL (ref 150.0–400.0)
RBC: 4.72 Mil/uL (ref 4.22–5.81)
RDW: 12.9 % (ref 11.5–15.5)
WBC: 6.6 10*3/uL (ref 4.0–10.5)

## 2021-02-01 LAB — LIPID PANEL
Cholesterol: 283 mg/dL — ABNORMAL HIGH (ref 0–200)
HDL: 40.9 mg/dL (ref >39.00)
NonHDL: 242.39
Total CHOL/HDL Ratio: 7
Triglycerides: 376 mg/dL — ABNORMAL HIGH (ref 0.0–149.0)
VLDL: 75.2 mg/dL — ABNORMAL HIGH (ref 0.0–40.0)

## 2021-02-01 LAB — PSA: PSA: 2.16 ng/mL (ref 0.10–4.00)

## 2021-02-01 LAB — LDL CHOLESTEROL, DIRECT: Direct LDL: 200 mg/dL

## 2021-02-01 MED ORDER — SILDENAFIL CITRATE 20 MG PO TABS
40.0000 mg | ORAL_TABLET | ORAL | 3 refills | Status: DC | PRN
  Filled 2021-02-01 – 2021-12-24 (×3): qty 50, 20d supply, fill #0

## 2021-02-01 MED ORDER — DULOXETINE HCL 60 MG PO CPEP
60.0000 mg | ORAL_CAPSULE | Freq: Every day | ORAL | 3 refills | Status: DC
  Filled 2021-02-01 – 2021-03-17 (×2): qty 90, 90d supply, fill #0
  Filled 2021-06-14: qty 90, 90d supply, fill #1
  Filled 2021-09-19: qty 90, 90d supply, fill #2
  Filled 2021-09-20: qty 90, 90d supply, fill #0
  Filled 2021-12-16: qty 90, 90d supply, fill #1

## 2021-02-01 MED ORDER — ATORVASTATIN CALCIUM 40 MG PO TABS
40.0000 mg | ORAL_TABLET | Freq: Every day | ORAL | 3 refills | Status: DC
  Filled 2021-02-01: qty 90, 90d supply, fill #0

## 2021-02-01 MED ORDER — GABAPENTIN 300 MG PO CAPS
900.0000 mg | ORAL_CAPSULE | Freq: Two times a day (BID) | ORAL | 3 refills | Status: DC
  Filled 2021-02-01 – 2021-06-28 (×4): qty 540, 90d supply, fill #0
  Filled 2021-09-23: qty 540, 90d supply, fill #1
  Filled 2021-12-22: qty 540, 90d supply, fill #2

## 2021-02-01 NOTE — Patient Instructions (Addendum)
Health Maintenance Due  Topic Date Due   Zoster Vaccines- Shingrix (1 of 2)  - Please check with your pharmacy to see if they have the shingrix vaccine. If they do- please get this immunization and update [place] by phone call or mychart with dates you receive the vaccine  Never done   Fecal DNA (Cologuard)   - Please let me know if you haven't received Cologuard in 3 weeks.  11/12/2020   Please stop by lab before you go If you have mychart- we will send your results within 3 business days of [place] receiving them.  If you do not have mychart- we will call you about results within 5 business days of [place] receiving them.  *please also note that you will see labs on mychart as soon as they post. I will later go in and write notes on them- will say "notes from Dr. Yong Channel"   Recommended follow up: Return in about 1 year (around 02/01/2022) for physical or sooner if needed.Marland Kitchen

## 2021-02-08 DIAGNOSIS — Z1211 Encounter for screening for malignant neoplasm of colon: Secondary | ICD-10-CM | POA: Diagnosis not present

## 2021-02-11 ENCOUNTER — Other Ambulatory Visit (HOSPITAL_COMMUNITY): Payer: Self-pay

## 2021-02-14 LAB — COLOGUARD: Cologuard: NEGATIVE

## 2021-02-15 ENCOUNTER — Other Ambulatory Visit (HOSPITAL_COMMUNITY): Payer: Self-pay

## 2021-03-18 ENCOUNTER — Other Ambulatory Visit (HOSPITAL_COMMUNITY): Payer: Self-pay

## 2021-03-27 ENCOUNTER — Other Ambulatory Visit (HOSPITAL_COMMUNITY): Payer: Self-pay

## 2021-03-29 ENCOUNTER — Other Ambulatory Visit (HOSPITAL_COMMUNITY): Payer: Self-pay

## 2021-06-14 ENCOUNTER — Other Ambulatory Visit (HOSPITAL_COMMUNITY): Payer: Self-pay

## 2021-06-18 ENCOUNTER — Other Ambulatory Visit (HOSPITAL_COMMUNITY): Payer: Self-pay

## 2021-06-18 DIAGNOSIS — H2513 Age-related nuclear cataract, bilateral: Secondary | ICD-10-CM | POA: Diagnosis not present

## 2021-06-18 DIAGNOSIS — H40033 Anatomical narrow angle, bilateral: Secondary | ICD-10-CM | POA: Diagnosis not present

## 2021-06-19 ENCOUNTER — Other Ambulatory Visit (HOSPITAL_COMMUNITY): Payer: Self-pay

## 2021-06-21 ENCOUNTER — Other Ambulatory Visit (HOSPITAL_COMMUNITY): Payer: Self-pay

## 2021-06-24 DIAGNOSIS — Z01818 Encounter for other preprocedural examination: Secondary | ICD-10-CM | POA: Diagnosis not present

## 2021-06-24 DIAGNOSIS — H353131 Nonexudative age-related macular degeneration, bilateral, early dry stage: Secondary | ICD-10-CM | POA: Diagnosis not present

## 2021-06-24 DIAGNOSIS — H40053 Ocular hypertension, bilateral: Secondary | ICD-10-CM | POA: Diagnosis not present

## 2021-06-24 DIAGNOSIS — H25812 Combined forms of age-related cataract, left eye: Secondary | ICD-10-CM | POA: Diagnosis not present

## 2021-06-26 ENCOUNTER — Other Ambulatory Visit (HOSPITAL_BASED_OUTPATIENT_CLINIC_OR_DEPARTMENT_OTHER): Payer: Self-pay

## 2021-06-26 MED ORDER — OFLOXACIN 0.3 % OP SOLN
OPHTHALMIC | 1 refills | Status: DC
  Filled 2021-06-26: qty 5, 7d supply, fill #0

## 2021-06-26 MED ORDER — PREDNISOLONE ACETATE 1 % OP SUSP
OPHTHALMIC | 5 refills | Status: DC
  Filled 2021-06-26: qty 5, 21d supply, fill #0

## 2021-06-28 ENCOUNTER — Other Ambulatory Visit (HOSPITAL_BASED_OUTPATIENT_CLINIC_OR_DEPARTMENT_OTHER): Payer: Self-pay

## 2021-07-01 DIAGNOSIS — H40052 Ocular hypertension, left eye: Secondary | ICD-10-CM | POA: Diagnosis not present

## 2021-07-01 DIAGNOSIS — H25812 Combined forms of age-related cataract, left eye: Secondary | ICD-10-CM | POA: Diagnosis not present

## 2021-07-01 DIAGNOSIS — H40053 Ocular hypertension, bilateral: Secondary | ICD-10-CM | POA: Diagnosis not present

## 2021-07-10 ENCOUNTER — Other Ambulatory Visit (HOSPITAL_BASED_OUTPATIENT_CLINIC_OR_DEPARTMENT_OTHER): Payer: Self-pay

## 2021-07-10 MED ORDER — OFLOXACIN 0.3 % OP SOLN
OPHTHALMIC | 1 refills | Status: DC
  Filled 2021-07-10: qty 5, 7d supply, fill #0

## 2021-07-10 MED ORDER — PREDNISOLONE ACETATE 1 % OP SUSP
OPHTHALMIC | 2 refills | Status: DC
  Filled 2021-07-10: qty 5, 21d supply, fill #0

## 2021-07-11 ENCOUNTER — Other Ambulatory Visit (HOSPITAL_BASED_OUTPATIENT_CLINIC_OR_DEPARTMENT_OTHER): Payer: Self-pay

## 2021-07-15 ENCOUNTER — Other Ambulatory Visit (HOSPITAL_BASED_OUTPATIENT_CLINIC_OR_DEPARTMENT_OTHER): Payer: Self-pay

## 2021-07-15 DIAGNOSIS — H40051 Ocular hypertension, right eye: Secondary | ICD-10-CM | POA: Diagnosis not present

## 2021-07-15 DIAGNOSIS — H25812 Combined forms of age-related cataract, left eye: Secondary | ICD-10-CM | POA: Diagnosis not present

## 2021-07-15 DIAGNOSIS — H40052 Ocular hypertension, left eye: Secondary | ICD-10-CM | POA: Diagnosis not present

## 2021-07-15 DIAGNOSIS — H25811 Combined forms of age-related cataract, right eye: Secondary | ICD-10-CM | POA: Diagnosis not present

## 2021-08-09 ENCOUNTER — Ambulatory Visit: Payer: PPO

## 2021-09-20 ENCOUNTER — Other Ambulatory Visit (HOSPITAL_COMMUNITY): Payer: Self-pay

## 2021-09-20 ENCOUNTER — Other Ambulatory Visit (HOSPITAL_BASED_OUTPATIENT_CLINIC_OR_DEPARTMENT_OTHER): Payer: Self-pay

## 2021-09-23 ENCOUNTER — Other Ambulatory Visit (HOSPITAL_BASED_OUTPATIENT_CLINIC_OR_DEPARTMENT_OTHER): Payer: Self-pay

## 2021-09-24 ENCOUNTER — Other Ambulatory Visit (HOSPITAL_BASED_OUTPATIENT_CLINIC_OR_DEPARTMENT_OTHER): Payer: Self-pay

## 2021-10-22 ENCOUNTER — Ambulatory Visit (INDEPENDENT_AMBULATORY_CARE_PROVIDER_SITE_OTHER): Payer: PPO

## 2021-10-22 DIAGNOSIS — Z0000 Encounter for general adult medical examination without abnormal findings: Secondary | ICD-10-CM

## 2021-10-22 NOTE — Progress Notes (Addendum)
Virtual Visit via Telephone Note  I connected with  Charles Amendola Sr. on 10/22/21 at  8:30 AM EDT by telephone and verified that I am speaking with the correct person using two identifiers.  Medicare Annual Wellness visit completed telephonically due to Covid-19 pandemic.   Persons participating in this call: This Health Coach and this patient.   Location: Patient: home Provider: office   I discussed the limitations, risks, security and privacy concerns of performing an evaluation and management service by telephone and the availability of in person appointments. The patient expressed understanding and agreed to proceed.  Unable to perform video visit due to video visit attempted and failed and/or patient does not have video capability.   Some vital signs may be absent or patient reported.   Willette Brace, LPN   Subjective:   Charles Marte. is a 71 y.o. male who presents for Medicare Annual/Subsequent preventive examination.  Review of Systems     Cardiac Risk Factors include: advanced age (>24mn, >>54women);male gender;dyslipidemia     Objective:    There were no vitals filed for this visit. There is no height or weight on file to calculate BMI.     10/22/2021    8:38 AM 08/06/2020    3:25 PM 11/25/2019    5:42 PM 07/26/2019    2:35 PM 01/29/2018    4:17 PM 08/27/2011    8:55 PM 08/26/2011    3:57 PM  Advanced Directives  Does Patient Have a Medical Advance Directive? Yes Yes No Yes Yes Patient has advance directive, copy not in chart Patient has advance directive, copy not in chart  Type of Advance Directive Healthcare Power of [place] will  Does patient want to make changes to medical advance directive?    No - Patient declined No - Patient declined    Copy of [place]? No - copy requested No - copy requested  No - copy requested No - copy requested Copy requested from family Copy requested from family  Pre-existing out of facility DNR order (yellow form or pink MOST form)      No     Current Medications (verified) Outpatient Encounter Medications as of 10/22/2021  Medication Sig   DULoxetine (CYMBALTA) 60 MG capsule Take 1 capsule (60 mg total) by mouth daily.   gabapentin (NEURONTIN) 300 MG capsule Take 3 capsules (900 mg total) by mouth 2 (two) times daily.   atorvastatin (LIPITOR) 40 MG tablet Take 1 tablet (40 mg total) by mouth daily. (Patient not taking: Reported on 10/22/2021)   FLUZONE HIGH-DOSE QUADRIVALENT 0.7 ML SUSY    sildenafil (REVATIO) 20 MG tablet Take 2-5 tablets (40-100 mg total) by mouth every other day as needed for erectile dysfunction. (Patient not taking: Reported on 10/22/2021)   [DISCONTINUED] acetaminophen (TYLENOL 8 HOUR) 650 MG CR tablet Take 1 tablet (650 mg total) by mouth every 8 (eight) hours as needed for pain. (Patient not taking: Reported on 02/01/2021)   [DISCONTINUED] ibuprofen (ADVIL) 200 MG tablet Take 200 mg by mouth every 6 (six) hours as needed. (Patient not taking: Reported on 02/01/2021)   [DISCONTINUED] latanoprost (XALATAN) 0.005 % ophthalmic solution SMARTSIG:1 Drop(s) In Eye(s) Every Evening (Patient not taking: Reported on 02/01/2021)   [DISCONTINUED] ofloxacin (OCUFLOX) 0.3 % ophthalmic solution instill 1 drop by ophthalmic route  4 times every day into left eye for 1 week, then stop   [DISCONTINUED] ofloxacin (OCUFLOX) 0.3 % ophthalmic solution instill 1 drop by ophthalmic route 4 times every day into right eye for 1 week   [DISCONTINUED] prednisoLONE acetate (PRED FORTE) 1 % ophthalmic suspension instill 1 drop into left eye 4 times a day for 1 wk, then 2  times a day for 1 wk, then daily for 1 wk the STOP   [DISCONTINUED] prednisoLONE acetate (PRED FORTE) 1 % ophthalmic suspension instill 1  drop into right eye 4 times a day for 1 wk, then 2  times a day for 1 wk, then daily for 1 wk the STOP   No facility-administered encounter medications on file as of 10/22/2021.    Allergies (verified) Codeine   History: Past Medical History:  Diagnosis Date   Asthma    albuterol once every other week, likely cat allergy   Chronic back pain    mild after back surgery L4 and L5 ruptured disc with surgery 2013   GERD (gastroesophageal reflux disease)    omeprazole prn   Hyperlipidemia    no rx   Neuropathy    gabapentin '300mg'$  BID   Past Surgical History:  Procedure Laterality Date   CARPAL TUNNEL RELEASE Right    Colonscopy     LUMBAR LAMINECTOMY/DECOMPRESSION MICRODISCECTOMY  08/27/2011   Procedure: LUMBAR LAMINECTOMY/DECOMPRESSION MICRODISCECTOMY 2 LEVELS;  Surgeon: Floyce Stakes, MD;  Location: [facility];  Service: Neurosurgery;  Laterality: Left;  Left Lumbar four- five, five-sacral one Diskectomy   Family History  Problem Relation Age of Onset   Hypercholesterolemia Mother        father, sisters, brothers   Stroke Mother        late 15s, early 27s   Diabetes Mother    Congestive Heart Failure Mother    Heart disease Sister        CABG sister 69- heavy smoker    Hypercholesterolemia Sister    Hypercholesterolemia Father    Hypercholesterolemia Brother    Hypercholesterolemia Sister    Hypercholesterolemia Sister    Hypercholesterolemia Sister    Early death Sister    Hydrocephalus Sister    Hypercholesterolemia Sister    Hypercholesterolemia Brother    Pancreatic cancer Brother    Cancer Brother    Hypercholesterolemia Brother    Hypercholesterolemia Brother    Anesthesia problems Neg Hx    Social History   Socioeconomic History   Marital status: Married    Spouse name: Not on file   Number of children: Not on file   Years of education: Not on file   Highest education level: Not on file  Occupational History   Not on file  Tobacco Use   Smoking  status: Never   Smokeless tobacco: Never  Vaping Use   Vaping Use: Never used  Substance and Sexual Activity   Alcohol use: Yes    Alcohol/week: 0.0 standard drinks    Comment: rarely 2 beers a year   Drug use: No   Sexual activity: Not on file  Other Topics Concern   Not on file  Social History Narrative   Family: Married 22 years in 2022 (2nd marriage). 4 biological kids, 2 step children- 10 grandkids, 4 step grandkids. 1 dog- pit bull and lab mix.    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      Hobbies: rest, deer and rabbit hunt   Social Determinants of Health   Financial Resource Strain: Low Risk    Difficulty of Paying Living Expenses: Not hard at all  Food Insecurity: No Food Insecurity   Worried About Charity fundraiser in the Last Year: Never true   Arboriculturist in the Last Year: Never true  Transportation Needs: No Transportation Needs   Lack of Transportation (Medical): No   Lack of Transportation (Non-Medical): No  Physical Activity: Inactive   Days of Exercise per Week: 0 days   Minutes of Exercise per Session: 0 min  Stress: No Stress Concern Present   Feeling of Stress : Not at all  Social Connections: Moderately Isolated   Frequency of Communication with Friends and Family: Never   Frequency of Social Gatherings with Friends and Family: More than three times a week   Attends Religious Services: Never   Marine scientist or Organizations: No   Attends Music therapist: Never   Marital Status: Married    Tobacco Counseling Counseling given: Not Answered   Clinical Intake:  Pre-visit preparation completed: Yes  Pain : No/denies pain     BMI - recorded: 29.62 Nutritional Status: BMI 25 -29 Overweight Nutritional Risks: None  How often do you need to have someone help you when you read instructions, pamphlets, or other written materials  from your doctor or pharmacy?: 1 - Never  Diabetic?no  Interpreter Needed?: No  Information entered by :: Charlott Rakes, LPN   Activities of Daily Living    10/22/2021    8:39 AM  In your present state of health, do you have any difficulty performing the following activities:  Hearing? 1  Comment slight hearing  Vision? 0  Difficulty concentrating or making decisions? 0  Walking or climbing stairs? 1  Comment with neuropathy  Dressing or bathing? 0  Doing errands, shopping? 0  Preparing Food and eating ? N  Using the Toilet? N  In the past six months, have you accidently leaked urine? N  Do you have problems with loss of bowel control? N  Managing your Medications? N  Managing your Finances? N  Housekeeping or managing your Housekeeping? N    Patient Care Team: Marin Olp, MD as PCP - General (Family Medicine) Danella Sensing, MD as Consulting Physician (Dermatology)  Indicate any recent Medical Services you may have received from other than [facility] providers in the past year (date may be approximate).     Assessment:   This is a routine wellness examination for Caine.  Hearing/Vision screen Hearing Screening - Comments:: Pt stated slight hearing loss  Vision Screening - Comments:: Pt follows up with [facility] for annual eye exams   Dietary issues and exercise activities discussed: Current Exercise Habits: The patient has a physically strenuous job, but has no regular exercise apart from work.   Goals Addressed             This Visit's Progress    Patient Stated       None at this time        Depression Screen    10/22/2021    8:37 AM 02/01/2021   10:11 AM 08/06/2020    3:23 PM 07/26/2019    2:36 PM 03/28/2019    2:41 PM 01/29/2018    4:21 PM 10/23/2016    3:37 PM  PHQ 2/9 Scores  PHQ - 2 Score  0 0 0 0 0 0 0    Fall Risk    10/22/2021    8:39 AM 02/01/2021   10:11 AM 08/06/2020    3:26 PM 07/26/2019    2:36 PM 01/29/2018    4:21 PM  Fall Risk    Falls in the past year? 0 0 0 0 No  Number falls in past yr: 0  0 0   Injury with Fall? 0  0 0   Risk for fall due to : Impaired vision No Fall Risks Impaired vision    Follow up Falls prevention discussed  Falls prevention discussed Falls evaluation completed;Education provided;Falls prevention discussed     FALL RISK PREVENTION PERTAINING TO THE HOME:  Any stairs in or around the home? Yes  If so, are there any without handrails? No  Home free of loose throw rugs in walkways, pet beds, electrical cords, etc? Yes  Adequate lighting in your home to reduce risk of falls? Yes   ASSISTIVE DEVICES UTILIZED TO PREVENT FALLS:  Life alert? No  Use of a cane, walker or w/c? No  Grab bars in the bathroom? No  Shower chair or bench in shower? Yes  Elevated toilet seat or a handicapped toilet? Yes   TIMED UP AND GO:  Was the test performed? No .  Cognitive Function:        10/22/2021    8:41 AM 08/06/2020    3:28 PM 07/26/2019    2:36 PM 01/29/2018    4:28 PM  6CIT Screen  What Year? 0 points 0 points 0 points 0 points  What month? 0 points 0 points 0 points 0 points  What time? 0 points  0 points 0 points  Count back from 20 0 points 0 points 0 points 0 points  Months in reverse 2 points 4 points 0 points 0 points  Repeat phrase 0 points 4 points 0 points   Total Score 2 points  0 points     Immunizations Immunization History  Administered Date(s) Administered   Influenza,inj,Quad PF,6+ Mos 02/14/2015, 04/11/2016   Influenza-Unspecified 05/04/2017, 04/06/2018, 03/17/2019   PFIZER(Purple Top)SARS-COV-2 Vaccination 01/27/2020, 02/18/2020   Pneumococcal Conjugate-13 10/22/2017   Pneumococcal Polysaccharide-23 03/28/2019   Tdap 10/15/2015    TDAP status: Up to date  Flu Vaccine status: Up to date  Pneumococcal vaccine status: Up to date  Covid-19 vaccine status: Completed vaccines  Qualifies for Shingles Vaccine? Yes   Zostavax completed No   Shingrix Completed?: No.     Education has been provided regarding the importance of this vaccine. Patient has been advised to call insurance company to determine out of pocket expense if they have not yet received this vaccine. Advised may also receive vaccine at local pharmacy or Health Dept. Verbalized acceptance and understanding.  Screening Tests Health Maintenance  Topic Date Due   Zoster Vaccines- Shingrix (1 of 2) Never done   COVID-19 Vaccine (3 - Booster for Pfizer series) 04/14/2020   INFLUENZA VACCINE  12/31/2021   Fecal DNA (Cologuard)  02/09/2024   TETANUS/TDAP  10/14/2025   Pneumonia Vaccine 37+ Years old  Completed   Hepatitis C Screening  Completed   HPV VACCINES  Aged Out    Health Maintenance  Health Maintenance Due  Topic Date Due   Zoster Vaccines- Shingrix (1 of 2) Never done   COVID-19 Vaccine (3 - Booster for Pfizer series) 04/14/2020    Colorectal cancer screening: Type of screening: Cologuard. Completed 02/08/21. Repeat every 3 years  Additional Screening:  Hepatitis C Screening: Completed 10/22/17  Vision Screening: Recommended annual ophthalmology exams for early detection of glaucoma and other disorders of the eye. Is the patient up to date with their annual eye exam?  Yes  Who is the provider or what is the name of the office in which the patient attends annual eye exams? [facility]  If pt is not established with a provider, would they like to be referred to a provider to establish care? No .   Dental Screening: Recommended annual dental exams for proper oral hygiene  Community Resource Referral / Chronic Care Management: CRR required this visit?  No   CCM required this visit?  No      Plan:     I have personally reviewed and noted the following in the patient's chart:   Medical and social history Use of alcohol, tobacco or illicit drugs  Current medications and supplements including opioid prescriptions. Patient is not currently taking opioid  prescriptions. Functional ability and status Nutritional status Physical activity Advanced directives List of other physicians Hospitalizations, surgeries, and ER visits in previous 12 months Vitals Screenings to include cognitive, depression, and falls Referrals and appointments  In addition, I have reviewed and discussed with patient certain preventive protocols, quality metrics, and best practice recommendations. A written personalized care plan for preventive services as well as general preventive health recommendations were provided to patient.     Willette Brace, LPN   [id number]   Nurse Notes: none

## 2021-10-22 NOTE — Patient Instructions (Signed)
Charles Harrington , Thank you for taking time to come for your Medicare Wellness Visit. I appreciate your ongoing commitment to your health goals. Please review the following plan we discussed and let me know if I can assist you in the future.   Screening recommendations/referrals: Colonoscopy: Cologuard 02/08/21 repeat every 3 years  Recommended yearly ophthalmology/optometry visit for glaucoma screening and checkup Recommended yearly dental visit for hygiene and checkup  Vaccinations: Influenza vaccine: Done 04/27/21 repeat every year Pneumococcal vaccine: Up to date Tdap vaccine: Done 10/15/15 repeat every 10 years  Shingles vaccine: Shingrix discussed. Please contact your pharmacy for coverage information.    Covid-19: Completed 8/27, 02/18/20  Advanced directives: Please bring a copy of your health care power of attorney and living will to the office at your convenience.  Conditions/risks identified: None at this time   Next appointment: Follow up in one year for your annual wellness visit.   Preventive Care 67 Years and Older, Male Preventive care refers to lifestyle choices and visits with your health care provider that can promote health and wellness. What does preventive care include? A yearly physical exam. This is also called an annual well check. Dental exams once or twice a year. Routine eye exams. Ask your health care provider how often you should have your eyes checked. Personal lifestyle choices, including: Daily care of your teeth and gums. Regular physical activity. Eating a healthy diet. Avoiding tobacco and drug use. Limiting alcohol use. Practicing safe sex. Taking low doses of aspirin every day. Taking vitamin and mineral supplements as recommended by your health care provider. What happens during an annual well check? The services and screenings done by your health care provider during your annual well check will depend on your age, overall health, lifestyle risk  factors, and family history of disease. Counseling  Your health care provider may ask you questions about your: Alcohol use. Tobacco use. Drug use. Emotional well-being. Home and relationship well-being. Sexual activity. Eating habits. History of falls. Memory and ability to understand (cognition). Work and work Statistician. Screening  You may have the following tests or measurements: Height, weight, and BMI. Blood pressure. Lipid and cholesterol levels. These may be checked every 5 years, or more frequently if you are over 42 years old. Skin check. Lung cancer screening. You may have this screening every year starting at age 24 if you have a 30-pack-year history of smoking and currently smoke or have quit within the past 15 years. Fecal occult blood test (FOBT) of the stool. You may have this test every year starting at age 89. Flexible sigmoidoscopy or colonoscopy. You may have a sigmoidoscopy every 5 years or a colonoscopy every 10 years starting at age 28. Prostate cancer screening. Recommendations will vary depending on your family history and other risks. Hepatitis C blood test. Hepatitis B blood test. Sexually transmitted disease (STD) testing. Diabetes screening. This is done by checking your blood sugar (glucose) after you have not eaten for a while (fasting). You may have this done every 1-3 years. Abdominal aortic aneurysm (AAA) screening. You may need this if you are a current or former smoker. Osteoporosis. You may be screened starting at age 28 if you are at high risk. Talk with your health care provider about your test results, treatment options, and if necessary, the need for more tests. Vaccines  Your health care provider may recommend certain vaccines, such as: Influenza vaccine. This is recommended every year. Tetanus, diphtheria, and acellular pertussis (Tdap, Td) vaccine. You  may need a Td booster every 10 years. Zoster vaccine. You may need this after age  30. Pneumococcal 13-valent conjugate (PCV13) vaccine. One dose is recommended after age 85. Pneumococcal polysaccharide (PPSV23) vaccine. One dose is recommended after age 79. Talk to your health care provider about which screenings and vaccines you need and how often you need them. This information is not intended to replace advice given to you by your health care provider. Make sure you discuss any questions you have with your health care provider. Document Released: 06/15/2015 Document Revised: 02/06/2016 Document Reviewed: 03/20/2015 Elsevier Interactive Patient Education  2017 [place] Prevention in the Home Falls can cause injuries. They can happen to people of all ages. There are many things you can do to make your home safe and to help prevent falls. What can I do on the outside of my home? Regularly fix the edges of walkways and driveways and fix any cracks. Remove anything that might make you trip as you walk through a door, such as a raised step or threshold. Trim any bushes or trees on the path to your home. Use bright outdoor lighting. Clear any walking paths of anything that might make someone trip, such as rocks or tools. Regularly check to see if handrails are loose or broken. Make sure that both sides of any steps have handrails. Any raised decks and porches should have guardrails on the edges. Have any leaves, snow, or ice cleared regularly. Use sand or salt on walking paths during winter. Clean up any spills in your garage right away. This includes oil or grease spills. What can I do in the bathroom? Use night lights. Install grab bars by the toilet and in the tub and shower. Do not use towel bars as grab bars. Use non-skid mats or decals in the tub or shower. If you need to sit down in the shower, use a plastic, non-slip stool. Keep the floor dry. Clean up any water that spills on the floor as soon as it happens. Remove soap buildup in the tub or shower  regularly. Attach bath mats securely with double-sided non-slip rug tape. Do not have throw rugs and other things on the floor that can make you trip. What can I do in the bedroom? Use night lights. Make sure that you have a light by your bed that is easy to reach. Do not use any sheets or blankets that are too big for your bed. They should not hang down onto the floor. Have a firm chair that has side arms. You can use this for support while you get dressed. Do not have throw rugs and other things on the floor that can make you trip. What can I do in the kitchen? Clean up any spills right away. Avoid walking on wet floors. Keep items that you use a lot in easy-to-reach places. If you need to reach something above you, use a strong step stool that has a grab bar. Keep electrical cords out of the way. Do not use floor polish or wax that makes floors slippery. If you must use wax, use non-skid floor wax. Do not have throw rugs and other things on the floor that can make you trip. What can I do with my stairs? Do not leave any items on the stairs. Make sure that there are handrails on both sides of the stairs and use them. Fix handrails that are broken or loose. Make sure that handrails are as long as the  stairways. Check any carpeting to make sure that it is firmly attached to the stairs. Fix any carpet that is loose or worn. Avoid having throw rugs at the top or bottom of the stairs. If you do have throw rugs, attach them to the floor with carpet tape. Make sure that you have a light switch at the top of the stairs and the bottom of the stairs. If you do not have them, ask someone to add them for you. What else can I do to help prevent falls? Wear shoes that: Do not have high heels. Have rubber bottoms. Are comfortable and fit you well. Are closed at the toe. Do not wear sandals. If you use a stepladder: Make sure that it is fully opened. Do not climb a closed stepladder. Make sure that  both sides of the stepladder are locked into place. Ask someone to hold it for you, if possible. Clearly mark and make sure that you can see: Any grab bars or handrails. First and last steps. Where the edge of each step is. Use tools that help you move around (mobility aids) if they are needed. These include: Canes. Walkers. Scooters. Crutches. Turn on the lights when you go into a dark area. Replace any light bulbs as soon as they burn out. Set up your furniture so you have a clear path. Avoid moving your furniture around. If any of your floors are uneven, fix them. If there are any pets around you, be aware of where they are. Review your medicines with your doctor. Some medicines can make you feel dizzy. This can increase your chance of falling. Ask your doctor what other things that you can do to help prevent falls. This information is not intended to replace advice given to you by your health care provider. Make sure you discuss any questions you have with your health care provider. Document Released: 03/15/2009 Document Revised: 10/25/2015 Document Reviewed: 06/23/2014 Elsevier Interactive Patient Education  2017 [employer].

## 2021-12-16 ENCOUNTER — Other Ambulatory Visit (HOSPITAL_BASED_OUTPATIENT_CLINIC_OR_DEPARTMENT_OTHER): Payer: Self-pay

## 2021-12-23 ENCOUNTER — Other Ambulatory Visit (HOSPITAL_BASED_OUTPATIENT_CLINIC_OR_DEPARTMENT_OTHER): Payer: Self-pay

## 2021-12-24 ENCOUNTER — Other Ambulatory Visit (HOSPITAL_BASED_OUTPATIENT_CLINIC_OR_DEPARTMENT_OTHER): Payer: Self-pay

## 2022-02-04 ENCOUNTER — Ambulatory Visit (INDEPENDENT_AMBULATORY_CARE_PROVIDER_SITE_OTHER): Payer: PPO | Admitting: Family Medicine

## 2022-02-04 ENCOUNTER — Encounter: Payer: Self-pay | Admitting: Family Medicine

## 2022-02-04 ENCOUNTER — Other Ambulatory Visit (HOSPITAL_BASED_OUTPATIENT_CLINIC_OR_DEPARTMENT_OTHER): Payer: Self-pay

## 2022-02-04 VITALS — BP 118/70 | HR 69 | Temp 98.6°F | Ht 73.0 in | Wt 223.0 lb

## 2022-02-04 DIAGNOSIS — R739 Hyperglycemia, unspecified: Secondary | ICD-10-CM

## 2022-02-04 DIAGNOSIS — E785 Hyperlipidemia, unspecified: Secondary | ICD-10-CM | POA: Diagnosis not present

## 2022-02-04 DIAGNOSIS — E538 Deficiency of other specified B group vitamins: Secondary | ICD-10-CM

## 2022-02-04 DIAGNOSIS — R351 Nocturia: Secondary | ICD-10-CM

## 2022-02-04 DIAGNOSIS — Z0000 Encounter for general adult medical examination without abnormal findings: Secondary | ICD-10-CM

## 2022-02-04 LAB — LIPID PANEL
Cholesterol: 246 mg/dL — ABNORMAL HIGH (ref 0–200)
HDL: 41.2 mg/dL (ref >39.00)
NonHDL: 204.71
Total CHOL/HDL Ratio: 6
Triglycerides: 229 mg/dL — ABNORMAL HIGH (ref 0.0–149.0)
VLDL: 45.8 mg/dL — ABNORMAL HIGH (ref 0.0–40.0)

## 2022-02-04 LAB — CBC WITH DIFFERENTIAL/PLATELET
Basophils Absolute: 0 10*3/uL (ref 0.0–0.1)
Basophils Relative: 0.5 % (ref 0.0–3.0)
Eosinophils Absolute: 0.1 10*3/uL (ref 0.0–0.7)
Eosinophils Relative: 1.5 % (ref 0.0–5.0)
HCT: 42.3 % (ref 39.0–52.0)
Hemoglobin: 14.1 g/dL (ref 13.0–17.0)
Lymphocytes Relative: 35 % (ref 12.0–46.0)
Lymphs Abs: 2.2 10*3/uL (ref 0.7–4.0)
MCHC: 33.3 g/dL (ref 30.0–36.0)
MCV: 92.1 fl (ref 78.0–100.0)
Monocytes Absolute: 0.4 10*3/uL (ref 0.1–1.0)
Monocytes Relative: 7 % (ref 3.0–12.0)
Neutro Abs: 3.5 10*3/uL (ref 1.4–7.7)
Neutrophils Relative %: 56 % (ref 43.0–77.0)
Platelets: 197 10*3/uL (ref 150.0–400.0)
RBC: 4.59 Mil/uL (ref 4.22–5.81)
RDW: 13.2 % (ref 11.5–15.5)
WBC: 6.3 10*3/uL (ref 4.0–10.5)

## 2022-02-04 LAB — COMPREHENSIVE METABOLIC PANEL
ALT: 16 U/L (ref 0–53)
AST: 15 U/L (ref 0–37)
Albumin: 4.1 g/dL (ref 3.5–5.2)
Alkaline Phosphatase: 89 U/L (ref 39–117)
BUN: 13 mg/dL (ref 6–23)
CO2: 29 mEq/L (ref 19–32)
Calcium: 9.7 mg/dL (ref 8.4–10.5)
Chloride: 101 mEq/L (ref 96–112)
Creatinine, Ser: 0.84 mg/dL (ref 0.40–1.50)
GFR: 87.96 mL/min (ref >60.00)
Glucose, Bld: 93 mg/dL (ref 70–99)
Potassium: 4.4 mEq/L (ref 3.5–5.1)
Sodium: 138 mEq/L (ref 135–145)
Total Bilirubin: 0.3 mg/dL (ref 0.2–1.2)
Total Protein: 7.3 g/dL (ref 6.0–8.3)

## 2022-02-04 LAB — HEMOGLOBIN A1C: Hgb A1c MFr Bld: 6 % (ref 4.6–6.5)

## 2022-02-04 LAB — PSA: PSA: 1.99 ng/mL (ref 0.10–4.00)

## 2022-02-04 LAB — LDL CHOLESTEROL, DIRECT: Direct LDL: 179 mg/dL

## 2022-02-04 LAB — VITAMIN B12: Vitamin B-12: 351 pg/mL (ref 211–911)

## 2022-02-04 MED ORDER — SILDENAFIL CITRATE 20 MG PO TABS
40.0000 mg | ORAL_TABLET | ORAL | 3 refills | Status: DC | PRN
  Filled 2022-02-04 – 2022-12-17 (×3): qty 50, 20d supply, fill #0

## 2022-02-04 MED ORDER — GABAPENTIN 300 MG PO CAPS
900.0000 mg | ORAL_CAPSULE | Freq: Two times a day (BID) | ORAL | 3 refills | Status: DC
  Filled 2022-02-04 – 2022-03-21 (×2): qty 540, 90d supply, fill #0
  Filled 2022-06-18: qty 540, 90d supply, fill #1
  Filled 2022-09-16: qty 540, 90d supply, fill #2
  Filled 2022-12-17 (×2): qty 540, 90d supply, fill #3

## 2022-02-04 MED ORDER — DULOXETINE HCL 60 MG PO CPEP
60.0000 mg | ORAL_CAPSULE | Freq: Every day | ORAL | 3 refills | Status: DC
  Filled 2022-02-04 – 2022-04-18 (×2): qty 90, 90d supply, fill #0
  Filled 2022-07-17: qty 90, 90d supply, fill #1
  Filled 2022-10-20: qty 90, 90d supply, fill #2
  Filled 2023-01-15: qty 90, 90d supply, fill #3

## 2022-02-04 MED ORDER — ALBUTEROL SULFATE HFA 108 (90 BASE) MCG/ACT IN AERS
2.0000 | INHALATION_SPRAY | Freq: Four times a day (QID) | RESPIRATORY_TRACT | 2 refills | Status: DC | PRN
  Filled 2022-02-04: qty 6.7, 25d supply, fill #0

## 2022-02-04 NOTE — Patient Instructions (Addendum)
Please stop by lab before you go If you have mychart- we will send your results within 3 business days of [place] receiving them.  If you do not have mychart- we will call you about results within 5 business days of [place] receiving them.  *please also note that you will see labs on mychart as soon as they post. I will later go in and write notes on them- will say "notes from Dr. Yong Channel"   If you change your mind and want to see neurology let me know  Recommended follow up: Return in about 1 year (around 02/05/2023) for physical or sooner if needed.Schedule b4 you leave.

## 2022-02-04 NOTE — Progress Notes (Signed)
Phone: [phone]   Subjective:  Patient presents today for their annual physical. Chief complaint-noted.   See problem oriented charting- ROS- full  review of systems was completed and negative  except for: ongoing neuropathy pain and seems to be moving up some , nocturia  The following were reviewed and entered/updated in epic: Past Medical History:  Diagnosis Date   Asthma    albuterol once every other week, likely cat allergy   Chronic back pain    mild after back surgery L4 and L5 ruptured disc with surgery 2013   GERD (gastroesophageal reflux disease)    omeprazole prn   Hyperlipidemia    no rx   Neuropathy    gabapentin '300mg'$  BID   Patient Active Problem List   Diagnosis Date Noted   B12 deficiency 03/28/2019    Priority: Medium    Hereditary and idiopathic peripheral neuropathy 02/14/2015    Priority: Medium    Hyperglycemia 02/14/2015    Priority: Medium    Asthma     Priority: Medium    Hyperlipidemia     Priority: Medium    Erectile dysfunction 08/18/2018    Priority: Low   Carpal tunnel syndrome 02/14/2015    Priority: Low   Hearing loss 02/14/2015    Priority: Low   GERD (gastroesophageal reflux disease)     Priority: Low   Chronic back pain     Priority: Low   Lichen planus 00/86/7619   Past Surgical History:  Procedure Laterality Date   CARPAL TUNNEL RELEASE Right    cataract surgery Bilateral    Colonscopy     LUMBAR LAMINECTOMY/DECOMPRESSION MICRODISCECTOMY  08/27/2011   Procedure: LUMBAR LAMINECTOMY/DECOMPRESSION MICRODISCECTOMY 2 LEVELS;  Surgeon: Floyce Stakes, MD;  Location: [facility];  Service: Neurosurgery;  Laterality: Left;  Left Lumbar four- five, five-sacral one Diskectomy    Family History  Problem Relation Age of Onset   Hypercholesterolemia Mother        father, sisters, brothers   Stroke Mother        late 74s, early 30s   Diabetes Mother    Congestive Heart Failure Mother    Heart disease Sister        CABG  sister 55- heavy smoker    Hypercholesterolemia Sister    Hypercholesterolemia Father    Hypercholesterolemia Brother    Hypercholesterolemia Sister    Hypercholesterolemia Sister    Hypercholesterolemia Sister    Early death Sister    Hydrocephalus Sister    Hypercholesterolemia Sister    Hypercholesterolemia Brother    Pancreatic cancer Brother    Cancer Brother    Hypercholesterolemia Brother    Hypercholesterolemia Brother    Anesthesia problems Neg Hx     Medications- reviewed and updated Current Outpatient Medications  Medication Sig Dispense Refill   DULoxetine (CYMBALTA) 60 MG capsule Take 1 capsule (60 mg total) by mouth daily. 90 capsule 3   gabapentin (NEURONTIN) 300 MG capsule Take 3 capsules (900 mg total) by mouth 2 (two) times daily. 540 capsule 3   sildenafil (REVATIO) 20 MG tablet Take 2-5 tablets (40-100 mg total) by mouth every other day as needed for erectile dysfunction. 50 tablet 3   No current facility-administered medications for this visit.    Allergies-reviewed and updated Allergies  Allergen Reactions   Atorvastatin     Myalgias even on once every other week 40 mg   Codeine Nausea And Vomiting    Social History   Social History Narrative  Family: Married 22 years in 2022 (2nd marriage). 4 biological kids, 2 step children- 10 grandkids, 4 step grandkids. 1 dog- pit bull and lab mix.    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 Oropharynx normal Neck: no thyromegaly CV: RRR no murmurs rubs or gallops Lungs: CTAB no crackles, wheeze, rhonchi Abdomen: soft/nontender/nondistended/normal bowel sounds. No  rebound or guarding.  Ext: minimal edema Skin: warm, dry Neuro: grossly normal, moves all extremities, PERRLA    Assessment and Plan  71 y.o. male presenting for annual physical.  Health Maintenance counseling: 1. Anticipatory guidance: Patient counseled regarding regular dental exams -q6 months, eye exams -yearly and had cataracts removed this yera,  avoiding smoking and second hand smoke, limiting alcohol to 2 beverages per day - doesn't drink, no illicit drugs .   2. Risk factor reduction:  Advised patient of need for regular exercise and diet rich and fruits and vegetables to reduce risk of heart attack and stroke.  Exercise- active around the home and working some- very active with work. Tries to walk but neuropathy bothers him.  Diet/weight management-weight up 5 pounds from last year- discussed dietary improved- he states weight was even higher and he is already working int back dwn Wt Readings from Last 3 Encounters:  02/04/22 223 lb (101.2 kg)  02/01/21 218 lb 6.4 oz (99.1 kg)  05/03/20 219 lb 9.6 oz (99.6 kg)  3. Immunizations/screenings/ancillary studies-declines flu shot and COVID shot, discussed Shingrix at pharmacy  Immunization History  Administered Date(s) Administered   Influenza,inj,Quad PF,6+ Mos 02/14/2015, 04/11/2016   Influenza-Unspecified 05/04/2017, 04/06/2018, 03/17/2019   PFIZER(Purple Top)SARS-COV-2 Vaccination 01/27/2020, 02/18/2020   Pneumococcal Conjugate-13 10/22/2017   Pneumococcal Polysaccharide-23 03/28/2019   Tdap 10/15/2015   4. Prostate cancer screening- PSA up some last year but prior check again 2017-want to check at least 1 final time to make sure stable Lab Results  Component Value Date   PSA 2.16 02/01/2021   PSA 1.31 10/08/2015   PSA 1.58 01/24/2013   5. Colon cancer screening - Cologuard 02/08/2021 with 3-year repeat planned 6. Skin cancer screening- dermatology a few years ago. advised regular sunscreen use. Denies worrisome, changing, or  new skin lesions.  7. Smoking associated screening (lung cancer screening, AAA screen 65-75, UA)-never smoker 8. STD screening - only active with wife  Status of chronic or acute concerns   #hyperlipidemia S: Medication:Atorvastatin 40 mg daily has been recommended--slightly increase diabetes risk- he states had myalgias and stopped - tried every other week and still bothered him A/P: update lipids first and then consider perhaps once a week rosuvastatin 10 mg- low dose to see if he tolerates this given significant myalgias   # Hyperglycemia/insulin resistance/prediabetes-A1c 6.0 in 2020 S:  Medication: none Lab Results  Component Value Date   HGBA1C 5.9 02/01/2021   HGBA1C 5.7 03/28/2019   HGBA1C 6.0 08/18/2018  A/P: hopefully stable or improved- update a1c today.    #Neuropathy S: Medication: Gabapentin 900 mg twice daily, Cymbalta 60 mg daily-  does ok at night but bothers in daytime A/P: ongoing issues- wants to continue current meds and hold off on neurology   # B12 deficiency S: Current treatment/medication (oral vs. IM): oral b12  A/P: hopefully stable- update b12 today. Continue current meds for now   #Asthma-albuterol as needed- has not needed much lately    #Erectile dysfunction-sildenafil helpful as needed  Recommended follow up: Return in about 1 year (around 02/05/2023) for physical or sooner if needed.Schedule b4 you leave. Future Appointments  Date Time Provider [place]  10/30/2022  8:30 AM [facility]   Lab/Order associations: NOT fasting- creamer in coffee   ICD-10-CM   1. Preventative health care  Z00.00     2. B12 deficiency  E53.8     3. Hyperglycemia  R73.9     4. Hyperlipidemia, unspecified hyperlipidemia type  E78.5     5. Nocturia  R35.1      No orders of the defined types were placed in this encounter.  Return precautions advised.  Garret Reddish, MD
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
## 2022-02-19 ENCOUNTER — Other Ambulatory Visit (HOSPITAL_BASED_OUTPATIENT_CLINIC_OR_DEPARTMENT_OTHER): Payer: Self-pay

## 2022-03-21 ENCOUNTER — Other Ambulatory Visit (HOSPITAL_BASED_OUTPATIENT_CLINIC_OR_DEPARTMENT_OTHER): Payer: Self-pay

## 2022-04-18 ENCOUNTER — Other Ambulatory Visit (HOSPITAL_BASED_OUTPATIENT_CLINIC_OR_DEPARTMENT_OTHER): Payer: Self-pay

## 2022-05-26 ENCOUNTER — Encounter: Payer: Self-pay | Admitting: Family Medicine

## 2022-05-28 NOTE — Telephone Encounter (Signed)
..  Type of form received: Disability parking placard  Additional comments:   Received by: patient drop off   Form should be Faxed to: na   Form should be mailed to:    Na  Is patient requesting call for pickup:   Yes  Form placed:   Dr Cala Bradford  Attach charge sheet.  Yes  Individual made aware of 3-5 business day turn around (Y/N)?  Yes

## 2022-06-19 ENCOUNTER — Other Ambulatory Visit (HOSPITAL_BASED_OUTPATIENT_CLINIC_OR_DEPARTMENT_OTHER): Payer: Self-pay

## 2022-10-30 ENCOUNTER — Ambulatory Visit (INDEPENDENT_AMBULATORY_CARE_PROVIDER_SITE_OTHER): Payer: PPO

## 2022-10-30 VITALS — Wt 223.0 lb

## 2022-10-30 DIAGNOSIS — Z0000 Encounter for general adult medical examination without abnormal findings: Secondary | ICD-10-CM

## 2022-10-30 NOTE — Patient Instructions (Signed)
Charles Harrington , Thank you for taking time to come for your Medicare Wellness Visit. I appreciate your ongoing commitment to your health goals. Please review the following plan we discussed and let me know if I can assist you in the future.   These are the goals we discussed:  Goals      DIET - INCREASE WATER INTAKE     Drink more water, less Gatorade     Patient Stated     None at this time     Patient Stated     None at this time      Patient Stated     None at this time         This is a list of the screening recommended for you and due dates:  Health Maintenance  Topic Date Due   Zoster (Shingles) Vaccine (1 of 2) Never done   COVID-19 Vaccine (3 - 2023-24 season) 01/31/2022   Flu Shot  01/01/2023   Medicare Annual Wellness Visit  10/30/2023   Cologuard (Stool DNA test)  02/09/2024   DTaP/Tdap/Td vaccine (2 - Td or Tdap) 10/14/2025   Pneumonia Vaccine  Completed   Hepatitis C Screening  Completed   HPV Vaccine  Aged Out    Advanced directives: Please bring a copy of your health care power of attorney and living will to the office at your convenience.  Conditions/risks identified: none at this time   Next appointment: Follow up in one year for your annual wellness visit.   Preventive Care 36 Years and Older, Male  Preventive care refers to lifestyle choices and visits with your health care provider that can promote health and wellness. What does preventive care include? A yearly physical exam. This is also called an annual well check. Dental exams once or twice a year. Routine eye exams. Ask your health care provider how often you should have your eyes checked. Personal lifestyle choices, including: Daily care of your teeth and gums. Regular physical activity. Eating a healthy diet. Avoiding tobacco and drug use. Limiting alcohol use. Practicing safe sex. Taking low doses of aspirin every day. Taking vitamin and mineral supplements as recommended by your health  care provider. What happens during an annual well check? The services and screenings done by your health care provider during your annual well check will depend on your age, overall health, lifestyle risk factors, and family history of disease. Counseling  Your health care provider may ask you questions about your: Alcohol use. Tobacco use. Drug use. Emotional well-being. Home and relationship well-being. Sexual activity. Eating habits. History of falls. Memory and ability to understand (cognition). Work and work Astronomer. Screening  You may have the following tests or measurements: Height, weight, and BMI. Blood pressure. Lipid and cholesterol levels. These may be checked every 5 years, or more frequently if you are over 53 years old. Skin check. Lung cancer screening. You may have this screening every year starting at age 13 if you have a 30-pack-year history of smoking and currently smoke or have quit within the past 15 years. Fecal occult blood test (FOBT) of the stool. You may have this test every year starting at age 60. Flexible sigmoidoscopy or colonoscopy. You may have a sigmoidoscopy every 5 years or a colonoscopy every 10 years starting at age 17. Prostate cancer screening. Recommendations will vary depending on your family history and other risks. Hepatitis C blood test. Hepatitis B blood test. Sexually transmitted disease (STD) testing. Diabetes screening. This is  done by checking your blood sugar (glucose) after you have not eaten for a while (fasting). You may have this done every 1-3 years. Abdominal aortic aneurysm (AAA) screening. You may need this if you are a current or former smoker. Osteoporosis. You may be screened starting at age 35 if you are at high risk. Talk with your health care provider about your test results, treatment options, and if necessary, the need for more tests. Vaccines  Your health care provider may recommend certain vaccines, such  as: Influenza vaccine. This is recommended every year. Tetanus, diphtheria, and acellular pertussis (Tdap, Td) vaccine. You may need a Td booster every 10 years. Zoster vaccine. You may need this after age 69. Pneumococcal 13-valent conjugate (PCV13) vaccine. One dose is recommended after age 80. Pneumococcal polysaccharide (PPSV23) vaccine. One dose is recommended after age 5. Talk to your health care provider about which screenings and vaccines you need and how often you need them. This information is not intended to replace advice given to you by your health care provider. Make sure you discuss any questions you have with your health care provider. Document Released: 06/15/2015 Document Revised: 02/06/2016 Document Reviewed: 03/20/2015 Elsevier Interactive Patient Education  2017 [employer].  Fall Prevention in the Home Falls can cause injuries. They can happen to people of all ages. There are many things you can do to make your home safe and to help prevent falls. What can I do on the outside of my home? Regularly fix the edges of walkways and driveways and fix any cracks. Remove anything that might make you trip as you walk through a door, such as a raised step or threshold. Trim any bushes or trees on the path to your home. Use bright outdoor lighting. Clear any walking paths of anything that might make someone trip, such as rocks or tools. Regularly check to see if handrails are loose or broken. Make sure that both sides of any steps have handrails. Any raised decks and porches should have guardrails on the edges. Have any leaves, snow, or ice cleared regularly. Use sand or salt on walking paths during winter. Clean up any spills in your garage right away. This includes oil or grease spills. What can I do in the bathroom? Use night lights. Install grab bars by the toilet and in the tub and shower. Do not use towel bars as grab bars. Use non-skid mats or decals in the tub or  shower. If you need to sit down in the shower, use a plastic, non-slip stool. Keep the floor dry. Clean up any water that spills on the floor as soon as it happens. Remove soap buildup in the tub or shower regularly. Attach bath mats securely with double-sided non-slip rug tape. Do not have throw rugs and other things on the floor that can make you trip. What can I do in the bedroom? Use night lights. Make sure that you have a light by your bed that is easy to reach. Do not use any sheets or blankets that are too big for your bed. They should not hang down onto the floor. Have a firm chair that has side arms. You can use this for support while you get dressed. Do not have throw rugs and other things on the floor that can make you trip. What can I do in the kitchen? Clean up any spills right away. Avoid walking on wet floors. Keep items that you use a lot in easy-to-reach places. If you need  to reach something above you, use a strong step stool that has a grab bar. Keep electrical cords out of the way. Do not use floor polish or wax that makes floors slippery. If you must use wax, use non-skid floor wax. Do not have throw rugs and other things on the floor that can make you trip. What can I do with my stairs? Do not leave any items on the stairs. Make sure that there are handrails on both sides of the stairs and use them. Fix handrails that are broken or loose. Make sure that handrails are as long as the stairways. Check any carpeting to make sure that it is firmly attached to the stairs. Fix any carpet that is loose or worn. Avoid having throw rugs at the top or bottom of the stairs. If you do have throw rugs, attach them to the floor with carpet tape. Make sure that you have a light switch at the top of the stairs and the bottom of the stairs. If you do not have them, ask someone to add them for you. What else can I do to help prevent falls? Wear shoes that: Do not have high heels. Have  rubber bottoms. Are comfortable and fit you well. Are closed at the toe. Do not wear sandals. If you use a stepladder: Make sure that it is fully opened. Do not climb a closed stepladder. Make sure that both sides of the stepladder are locked into place. Ask someone to hold it for you, if possible. Clearly mark and make sure that you can see: Any grab bars or handrails. First and last steps. Where the edge of each step is. Use tools that help you move around (mobility aids) if they are needed. These include: Canes. Walkers. Scooters. Crutches. Turn on the lights when you go into a dark area. Replace any light bulbs as soon as they burn out. Set up your furniture so you have a clear path. Avoid moving your furniture around. If any of your floors are uneven, fix them. If there are any pets around you, be aware of where they are. Review your medicines with your doctor. Some medicines can make you feel dizzy. This can increase your chance of falling. Ask your doctor what other things that you can do to help prevent falls. This information is not intended to replace advice given to you by your health care provider. Make sure you discuss any questions you have with your health care provider. Document Released: 03/15/2009 Document Revised: 10/25/2015 Document Reviewed: 06/23/2014 Elsevier Interactive Patient Education  2017 [employer].

## 2022-10-30 NOTE — Progress Notes (Signed)
I connected with  Charles Alsobrooks Sr. on 10/30/22 by a audio enabled telemedicine application and verified that I am speaking with the correct person using two identifiers.  Patient Location: Home  Provider Location: Office/Clinic  I discussed the limitations of evaluation and management by telemedicine. The patient expressed understanding and agreed to proceed.   Subjective:   Charles Basom Sr. is a 72 y.o. male who presents for Medicare Annual/Subsequent preventive examination.  Review of Systems     Cardiac Risk Factors include: advanced age (>36men, >58 women);dyslipidemia;male gender     Objective:    Today's Vitals   10/30/22 0830  Weight: 223 lb (101.2 kg)   Body mass index is 29.42 kg/m.     10/30/2022    8:34 AM 10/22/2021    8:38 AM 08/06/2020    3:25 PM 11/25/2019    5:42 PM 07/26/2019    2:35 PM 01/29/2018    4:17 PM 08/27/2011    8:55 PM  Advanced Directives  Does Patient Have a Medical Advance Directive? Yes Yes Yes No Yes Yes Patient has advance directive, copy not in chart  Type of Advance Directive Healthcare Power of [place];Living will Healthcare Power of [employer] Power of Attorney  Living will;Healthcare Power of Attorney Living will;Healthcare Power of [employer] Power of [place];Living will  Does patient want to make changes to medical advance directive?     No - Patient declined No - Patient declined   Copy of Healthcare Power of Attorney in Chart? No - copy requested No - copy requested No - copy requested  No - copy requested No - copy requested Copy requested from family  Pre-existing out of facility DNR order (yellow form or pink MOST form)       No    Current Medications (verified) Outpatient Encounter Medications as of 10/30/2022  Medication Sig   albuterol (VENTOLIN HFA) 108 (90 Base) MCG/ACT inhaler Inhale 2 puffs into the lungs every 6 (six) hours as needed for wheezing or shortness of breath.   DULoxetine (CYMBALTA)  60 MG capsule Take 1 capsule (60 mg total) by mouth daily.   gabapentin (NEURONTIN) 300 MG capsule Take 3 capsules (900 mg total) by mouth 2 (two) times daily.   sildenafil (REVATIO) 20 MG tablet Take 2-5 tablets (40-100 mg total) by mouth every other day as needed for erectile dysfunction.   No facility-administered encounter medications on file as of 10/30/2022.    Allergies (verified) Atorvastatin and Codeine   History: Past Medical History:  Diagnosis Date   Asthma    albuterol once every other week, likely cat allergy   Chronic back pain    mild after back surgery L4 and L5 ruptured disc with surgery 2013   GERD (gastroesophageal reflux disease)    omeprazole prn   Hyperlipidemia    no rx   Neuropathy    gabapentin 300mg  BID   Past Surgical History:  Procedure Laterality Date   CARPAL TUNNEL RELEASE Right    cataract surgery Bilateral    Colonscopy     LUMBAR LAMINECTOMY/DECOMPRESSION MICRODISCECTOMY  08/27/2011   Procedure: LUMBAR LAMINECTOMY/DECOMPRESSION MICRODISCECTOMY 2 LEVELS;  Surgeon: Karn Cassis, MD;  Location: [facility];  Service: Neurosurgery;  Laterality: Left;  Left Lumbar four- five, five-sacral one Diskectomy   Family History  Problem Relation Age of Onset   Hypercholesterolemia Mother        father, sisters, brothers   Stroke Mother  late 75s, early 7s   Diabetes Mother    Congestive Heart Failure Mother    Heart disease Sister        CABG sister 39- heavy smoker    Hypercholesterolemia Sister    Hypercholesterolemia Father    Hypercholesterolemia Brother    Hypercholesterolemia Sister    Hypercholesterolemia Sister    Hypercholesterolemia Sister    Early death Sister    Hydrocephalus Sister    Hypercholesterolemia Sister    Hypercholesterolemia Brother    Pancreatic cancer Brother    Cancer Brother    Hypercholesterolemia Brother    Hypercholesterolemia Brother    Anesthesia problems Neg Hx    Social History    Socioeconomic History   Marital status: Married    Spouse name: Not on file   Number of children: Not on file   Years of education: Not on file   Highest education level: Not on file  Occupational History   Not on file  Tobacco Use   Smoking status: Never   Smokeless tobacco: Never  Vaping Use   Vaping Use: Never used  Substance and Sexual Activity   Alcohol use: Yes    Alcohol/week: 0.0 standard drinks of alcohol    Comment: rarely 2 beers a year   Drug use: No   Sexual activity: Not on file  Other Topics Concern   Not on file  Social History Narrative   Family: Married 22 years in 2022 (2nd marriage). 4 biological kids, 2 step children- 10 grandkids, 4 step grandkids. 1 dog- pit bull and lab mix.    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      Hobbies: rest, deer and rabbit hunt   Social Determinants of Corporate investment banker Strain: Low Risk  (10/30/2022)   Overall Financial Resource Strain (CARDIA)    Difficulty of Paying Living Expenses: Not hard at all  Food Insecurity: No Food Insecurity (10/30/2022)   Hunger Vital Sign    Worried About Running Out of Food in the Last Year: Never true    Ran Out of Food in the Last Year: Never true  Transportation Needs: No Transportation Needs (10/30/2022)   PRAPARE - Administrator, Civil Service (Medical): No    Lack of Transportation (Non-Medical): No  Physical Activity: Inactive (10/30/2022)   Exercise Vital Sign    Days of Exercise per Week: 0 days    Minutes of Exercise per Session: 0 min  Stress: No Stress Concern Present (10/30/2022)   [employer] of Occupational Health - Occupational Stress Questionnaire    Feeling of Stress : Not at all  Social Connections: Moderately Isolated (10/30/2022)   Social Connection and Isolation Panel [NHANES]    Frequency of Communication with Friends and Family: More than three  times a week    Frequency of Social Gatherings with Friends and Family: More than three times a week    Attends Religious Services: Never    Database administrator or Organizations: No    Attends Engineer, structural: Never    Marital Status: Married    Tobacco Counseling Counseling given: Not Answered   Clinical Intake:  Pre-visit preparation completed: Yes  Pain : No/denies pain     BMI - recorded: 29.42 Nutritional Status: BMI 25 -29 Overweight Nutritional Risks: None Diabetes: No  How often do  you need to have someone help you when you read instructions, pamphlets, or other written materials from your doctor or pharmacy?: 1 - Never  Diabetic?no  Interpreter Needed?: No  Information entered by :: Lanier Ensign, LPN   Activities of Daily Living    10/30/2022    8:35 AM  In your present state of health, do you have any difficulty performing the following activities:  Hearing? 0  Vision? 0  Difficulty concentrating or making decisions? 0  Walking or climbing stairs? 0  Dressing or bathing? 0  Doing errands, shopping? 0  Preparing Food and eating ? N  Using the Toilet? N  In the past six months, have you accidently leaked urine? N  Do you have problems with loss of bowel control? N  Managing your Medications? N  Managing your Finances? N  Housekeeping or managing your Housekeeping? N    Patient Care Team: Shelva Majestic, MD as PCP - General (Family Medicine) Arminda Resides, MD as Consulting Physician (Dermatology)  Indicate any recent Medical Services you may have received from other than [facility] providers in the past year (date may be approximate).     Assessment:   This is a routine wellness examination for [place].  Hearing/Vision screen Hearing Screening - Comments:: Pt denies any hearing issues  Vision Screening - Comments:: Pt follows up with eye provider [address]   Dietary issues and exercise activities discussed: Current  Exercise Habits: The patient has a physically strenuous job, but has no regular exercise apart from work.   Goals Addressed             This Visit's Progress    Patient Stated       None at this time        Depression Screen    10/30/2022    8:33 AM 10/22/2021    8:37 AM 02/01/2021   10:11 AM 08/06/2020    3:23 PM 07/26/2019    2:36 PM 03/28/2019    2:41 PM 01/29/2018    4:21 PM  PHQ 2/9 Scores  PHQ - 2 Score 0 0 0 0 0 0 0    Fall Risk    10/30/2022    8:35 AM 10/22/2021    8:39 AM 02/01/2021   10:11 AM 08/06/2020    3:26 PM 07/26/2019    2:36 PM  Fall Risk   Falls in the past year? 0 0 0 0 0  Number falls in past yr: 0 0  0 0  Injury with Fall? 0 0  0 0  Risk for fall due to : Impaired vision Impaired vision No Fall Risks Impaired vision   Follow up Falls prevention discussed Falls prevention discussed  Falls prevention discussed Falls evaluation completed;Education provided;Falls prevention discussed    FALL RISK PREVENTION PERTAINING TO THE HOME:  Any stairs in or around the home? Yes  If so, are there any without handrails? No  Home free of loose throw rugs in walkways, pet beds, electrical cords, etc? Yes  Adequate lighting in your home to reduce risk of falls? Yes   ASSISTIVE DEVICES UTILIZED TO PREVENT FALLS:  Life alert? No  Use of a cane, walker or w/c? No  Grab bars in the bathroom? Yes  Shower chair or bench in shower? Yes  Elevated toilet seat or a handicapped toilet? Yes   TIMED UP AND GO:  Was the test performed? No .   Cognitive Function:        10/30/2022  8:38 AM 10/22/2021    8:41 AM 08/06/2020    3:28 PM 07/26/2019    2:36 PM 01/29/2018    4:28 PM  6CIT Screen  What Year? 0 points 0 points 0 points 0 points 0 points  What month? 0 points 0 points 0 points 0 points 0 points  What time? 0 points 0 points  0 points 0 points  Count back from 20 0 points 0 points 0 points 0 points 0 points  Months in reverse 0 points 2 points 4 points 0 points  0 points  Repeat phrase 0 points 0 points 4 points 0 points   Total Score 0 points 2 points  0 points     Immunizations Immunization History  Administered Date(s) Administered   Influenza,inj,Quad PF,6+ Mos 02/14/2015, 04/11/2016   Influenza-Unspecified 05/04/2017, 04/06/2018, 03/17/2019   PFIZER(Purple Top)SARS-COV-2 Vaccination 01/27/2020, 02/18/2020   Pneumococcal Conjugate-13 10/22/2017   Pneumococcal Polysaccharide-23 03/28/2019   Tdap 10/15/2015    TDAP status: Up to date  Flu Vaccine status: Due, Education has been provided regarding the importance of this vaccine. Advised may receive this vaccine at local pharmacy or Health Dept. Aware to provide a copy of the vaccination record if obtained from local pharmacy or Health Dept. Verbalized acceptance and understanding.  Pneumococcal vaccine status: Up to date  Covid-19 vaccine status: Completed vaccines  Qualifies for Shingles Vaccine? Yes   Zostavax completed No   Shingrix Completed?: No.    Education has been provided regarding the importance of this vaccine. Patient has been advised to call insurance company to determine out of pocket expense if they have not yet received this vaccine. Advised may also receive vaccine at local pharmacy or Health Dept. Verbalized acceptance and understanding.  Screening Tests Health Maintenance  Topic Date Due   Zoster Vaccines- Shingrix (1 of 2) Never done   COVID-19 Vaccine (3 - 2023-24 season) 01/31/2022   INFLUENZA VACCINE  01/01/2023   Medicare Annual Wellness (AWV)  10/30/2023   Fecal DNA (Cologuard)  02/09/2024   DTaP/Tdap/Td (2 - Td or Tdap) 10/14/2025   Pneumonia Vaccine 20+ Years old  Completed   Hepatitis C Screening  Completed   HPV VACCINES  Aged Out    Health Maintenance  Health Maintenance Due  Topic Date Due   Zoster Vaccines- Shingrix (1 of 2) Never done   COVID-19 Vaccine (3 - 2023-24 season) 01/31/2022    Colorectal cancer screening: Type of screening:  Cologuard. Completed 02/08/21. Repeat every 3 years  Additional Screening:  Hepatitis C Screening: Completed 10/22/17  Vision Screening: Recommended annual ophthalmology exams for early detection of glaucoma and other disorders of the eye. Is the patient up to date with their annual eye exam?  Yes  Who is the provider or what is the name of the office in which the patient attends annual eye exams? Provider [address]  If pt is not established with a provider, would they like to be referred to a provider to establish care? No .   Dental Screening: Recommended annual dental exams for proper oral hygiene  Community Resource Referral / Chronic Care Management: CRR required this visit?  No   CCM required this visit?  No      Plan:     I have personally reviewed and noted the following in the patient's chart:   Medical and social history Use of alcohol, tobacco or illicit drugs  Current medications and supplements including opioid prescriptions. Patient is not currently taking opioid prescriptions.  Functional ability and status Nutritional status Physical activity Advanced directives List of other physicians Hospitalizations, surgeries, and ER visits in previous 12 months Vitals Screenings to include cognitive, depression, and falls Referrals and appointments  In addition, I have reviewed and discussed with patient certain preventive protocols, quality metrics, and best practice recommendations. A written personalized care plan for preventive services as well as general preventive health recommendations were provided to patient.     Marzella Schlein, LPN   1/61/0960   Nurse Notes: none

## 2022-12-17 ENCOUNTER — Other Ambulatory Visit (HOSPITAL_BASED_OUTPATIENT_CLINIC_OR_DEPARTMENT_OTHER): Payer: Self-pay

## 2023-01-15 ENCOUNTER — Encounter (INDEPENDENT_AMBULATORY_CARE_PROVIDER_SITE_OTHER): Payer: Self-pay

## 2023-01-16 ENCOUNTER — Other Ambulatory Visit (HOSPITAL_BASED_OUTPATIENT_CLINIC_OR_DEPARTMENT_OTHER): Payer: Self-pay

## 2023-01-17 ENCOUNTER — Other Ambulatory Visit (HOSPITAL_BASED_OUTPATIENT_CLINIC_OR_DEPARTMENT_OTHER): Payer: Self-pay

## 2023-02-06 ENCOUNTER — Encounter: Payer: Self-pay | Admitting: Family Medicine

## 2023-02-06 ENCOUNTER — Ambulatory Visit (INDEPENDENT_AMBULATORY_CARE_PROVIDER_SITE_OTHER): Payer: PPO | Admitting: Family Medicine

## 2023-02-06 VITALS — BP 148/88 | HR 82 | Temp 97.9°F | Ht 73.0 in | Wt 227.6 lb

## 2023-02-06 DIAGNOSIS — Z0000 Encounter for general adult medical examination without abnormal findings: Secondary | ICD-10-CM | POA: Diagnosis not present

## 2023-02-06 DIAGNOSIS — Z131 Encounter for screening for diabetes mellitus: Secondary | ICD-10-CM

## 2023-02-06 DIAGNOSIS — E785 Hyperlipidemia, unspecified: Secondary | ICD-10-CM | POA: Diagnosis not present

## 2023-02-06 DIAGNOSIS — E538 Deficiency of other specified B group vitamins: Secondary | ICD-10-CM | POA: Diagnosis not present

## 2023-02-06 DIAGNOSIS — R739 Hyperglycemia, unspecified: Secondary | ICD-10-CM

## 2023-02-06 LAB — CBC WITH DIFFERENTIAL/PLATELET
Basophils Absolute: 0 10*3/uL (ref 0.0–0.1)
Basophils Relative: 0.5 % (ref 0.0–3.0)
Eosinophils Absolute: 0.1 10*3/uL (ref 0.0–0.7)
Eosinophils Relative: 1.4 % (ref 0.0–5.0)
HCT: 45.1 % (ref 39.0–52.0)
Hemoglobin: 14.8 g/dL (ref 13.0–17.0)
Lymphocytes Relative: 35.9 % (ref 12.0–46.0)
Lymphs Abs: 2.1 10*3/uL (ref 0.7–4.0)
MCHC: 32.8 g/dL (ref 30.0–36.0)
MCV: 92.1 fl (ref 78.0–100.0)
Monocytes Absolute: 0.5 10*3/uL (ref 0.1–1.0)
Monocytes Relative: 8.7 % (ref 3.0–12.0)
Neutro Abs: 3.2 10*3/uL (ref 1.4–7.7)
Neutrophils Relative %: 53.5 % (ref 43.0–77.0)
Platelets: 210 10*3/uL (ref 150.0–400.0)
RBC: 4.9 Mil/uL (ref 4.22–5.81)
RDW: 13 % (ref 11.5–15.5)
WBC: 5.9 10*3/uL (ref 4.0–10.5)

## 2023-02-06 LAB — COMPREHENSIVE METABOLIC PANEL
ALT: 23 U/L (ref 0–53)
AST: 22 U/L (ref 0–37)
Albumin: 4.1 g/dL (ref 3.5–5.2)
Alkaline Phosphatase: 90 U/L (ref 39–117)
BUN: 14 mg/dL (ref 6–23)
CO2: 27 meq/L (ref 19–32)
Calcium: 9.6 mg/dL (ref 8.4–10.5)
Chloride: 103 meq/L (ref 96–112)
Creatinine, Ser: 0.94 mg/dL (ref 0.40–1.50)
GFR: 81.2 mL/min (ref >60.00)
Glucose, Bld: 95 mg/dL (ref 70–99)
Potassium: 4.2 meq/L (ref 3.5–5.1)
Sodium: 138 meq/L (ref 135–145)
Total Bilirubin: 0.4 mg/dL (ref 0.2–1.2)
Total Protein: 7.6 g/dL (ref 6.0–8.3)

## 2023-02-06 LAB — LIPID PANEL
Cholesterol: 276 mg/dL — ABNORMAL HIGH (ref 0–200)
HDL: 40.6 mg/dL (ref >39.00)
LDL Cholesterol: 181 mg/dL — ABNORMAL HIGH (ref 0–99)
NonHDL: 235.01
Total CHOL/HDL Ratio: 7
Triglycerides: 268 mg/dL — ABNORMAL HIGH (ref 0.0–149.0)
VLDL: 53.6 mg/dL — ABNORMAL HIGH (ref 0.0–40.0)

## 2023-02-06 LAB — VITAMIN B12: Vitamin B-12: 253 pg/mL (ref 211–911)

## 2023-02-06 LAB — HEMOGLOBIN A1C: Hgb A1c MFr Bld: 6.1 % (ref 4.6–6.5)

## 2023-02-06 NOTE — Progress Notes (Signed)
Phone: [phone]   Subjective:  Patient presents today for their annual physical. Chief complaint-noted.   See problem oriented charting- ROS- full  review of systems was completed and negative  Per full ROS sheet completed by patient  The following were reviewed and entered/updated in epic: Past Medical History:  Diagnosis Date   Asthma    albuterol once every other week, likely cat allergy   Chronic back pain    mild after back surgery L4 and L5 ruptured disc with surgery 2013   GERD (gastroesophageal reflux disease)    omeprazole prn   Hyperlipidemia    no rx   Neuropathy    gabapentin 300mg  BID   Patient Active Problem List   Diagnosis Date Noted   B12 deficiency 03/28/2019    Priority: Medium    Hereditary and idiopathic peripheral neuropathy 02/14/2015    Priority: Medium    Hyperglycemia 02/14/2015    Priority: Medium    Asthma     Priority: Medium    Hyperlipidemia     Priority: Medium    Erectile dysfunction 08/18/2018    Priority: Low   Carpal tunnel syndrome 02/14/2015    Priority: Low   Hearing loss 02/14/2015    Priority: Low   GERD (gastroesophageal reflux disease)     Priority: Low   Chronic back pain     Priority: Low   Lichen planus 10/23/2016   Past Surgical History:  Procedure Laterality Date   CARPAL TUNNEL RELEASE Right    cataract surgery Bilateral    Colonscopy     LUMBAR LAMINECTOMY/DECOMPRESSION MICRODISCECTOMY  08/27/2011   Procedure: LUMBAR LAMINECTOMY/DECOMPRESSION MICRODISCECTOMY 2 LEVELS;  Surgeon: Karn Cassis, MD;  Location: [facility];  Service: Neurosurgery;  Laterality: Left;  Left Lumbar four- five, five-sacral one Diskectomy    Family History  Problem Relation Age of Onset   Hypercholesterolemia Mother        father, sisters, brothers   Stroke Mother        late 78s, early 66s   Diabetes Mother    Congestive Heart Failure Mother    Heart disease Sister        CABG sister 46- heavy smoker     Hypercholesterolemia Sister    Hypercholesterolemia Father    Hypercholesterolemia Brother    Hypercholesterolemia Sister    Hypercholesterolemia Sister    Hypercholesterolemia Sister    Early death Sister    Hydrocephalus Sister    Hypercholesterolemia Sister    Hypercholesterolemia Brother    Pancreatic cancer Brother    Cancer Brother    Hypercholesterolemia Brother    Hypercholesterolemia Brother    Anesthesia problems Neg Hx     Medications- reviewed and updated Current Outpatient Medications  Medication Sig Dispense Refill   albuterol (VENTOLIN HFA) 108 (90 Base) MCG/ACT inhaler Inhale 2 puffs into the lungs every 6 (six) hours as needed for wheezing or shortness of breath. 6.7 g 2   DULoxetine (CYMBALTA) 60 MG capsule Take 1 capsule (60 mg total) by mouth daily. 90 capsule 3   gabapentin (NEURONTIN) 300 MG capsule Take 3 capsules (900 mg total) by mouth 2 (two) times daily. 540 capsule 3   sildenafil (REVATIO) 20 MG tablet Take 2-5 tablets (40-100 mg total) by mouth every other day as needed for erectile dysfunction. 50 tablet 3   No current facility-administered medications for this visit.    Allergies-reviewed and updated Allergies  Allergen Reactions   Atorvastatin     Myalgias even on  once every other week 40 mg   Codeine Nausea And Vomiting    Social History   Social History Narrative   Family: Married 22 years in 2022 (2nd marriage). 4 biological kids, 2 step children- 10 grandkids, 4 step grandkids. 1 dog- pit bull and lab mix.    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 Oropharynx normal Neck: no  thyromegaly CV: RRR no murmurs rubs or gallops Lungs: CTAB no crackles, wheeze, rhonchi Abdomen: soft/nontender/nondistended/normal bowel sounds. No rebound or guarding.  Ext: no edema Skin: warm, dry Neuro: grossly normal, moves all extremities, PERRLA   Assessment and Plan  72 y.o. male presenting for annual physical.  Health Maintenance counseling: 1. Anticipatory guidance: Patient counseled regarding regular dental exams -q6 months, eye exams -yearly,  avoiding smoking and second hand smoke, limiting alcohol to 2 beverages per day - doesn't drink, no illicit drugs .   2. Risk factor reduction:  Advised patient of need for regular exercise and diet rich and fruits and vegetables to reduce risk of heart attack and stroke.  Exercise- active with work but hard to walk or doing other exercise with neuropathy.  Diet/weight management-weight up 4 lbs in last year- encouraged mild weight loss.  Wt Readings from Last 3 Encounters:  02/06/23 227 lb 9.6 oz (103.2 kg)  10/30/22 223 lb (101.2 kg)  02/04/22 223 lb (101.2 kg)   3. Immunizations/screenings/ancillary studies- opts out of flu for now- will get later, opts out of COVID long term, opts out of shingrix Immunization History  Administered Date(s) Administered   Influenza,inj,Quad PF,6+ Mos 02/14/2015, 04/11/2016   Influenza-Unspecified 05/04/2017, 04/06/2018, 03/17/2019   PFIZER(Purple Top)SARS-COV-2 Vaccination 01/27/2020, 02/18/2020   Pneumococcal Conjugate-13 10/22/2017   Pneumococcal Polysaccharide-23 03/28/2019   Tdap 10/15/2015   4. Prostate cancer screening-  PSA trended down slightly last year- we did another repeat due to gradual increase from 2017 to 2022- he prefers to hold off on repeat Lab Results  Component Value Date   PSA 1.99 02/04/2022   PSA 2.16 02/01/2021   PSA 1.31 10/08/2015   5. Colon cancer screening - 02/08/21 with 3 year repeat    6. Skin cancer screening- dermatology every few years. advised regular  sunscreen use. Denies worrisome, changing, or new skin lesions.  7. Smoking associated screening (lung cancer screening, AAA screen 65-75, UA)- never smoker 8. STD screening - only active with wife  Status of chronic or acute concerns   #hyperlipidemia with statin myalgia history on atorvastatin 40 mg even every other week S: Medication:none  Lab Results  Component Value Date   CHOL 246 (H) 02/04/2022   HDL 41.20 02/04/2022   LDLCALC 172 (H) 10/08/2015   LDLDIRECT 179.0 02/04/2022   TRIG 229.0 (H) 02/04/2022   CHOLHDL 6 02/04/2022   A/P: cholesterol very high but did not tolerate atorvastatin- update lipids and consider rosuvastatin 10 mg even just once a week to see if he tolerates it.   # Hyperglycemia/insulin resistance/prediabetes-last A1c 6.0  S:  Medication: none Lab Results  Component Value Date   HGBA1C 6.0 02/04/2022   HGBA1C 5.9 02/01/2021   HGBA1C 5.7 03/28/2019  A/P: hopefully stable- update a1c today. Continue current meds for now   # Neuropathy S:Medication: Cymbalta 60 mg daily, gabapentin 900 mg twice daily -reports saw neurology over 10 years ago A/P: imperfect control- still bothers him but this regimen at least helps take edge   # B12 deficiency S: Current treatment/medication (oral vs. IM): Oral B12 thinks A/P: hopefully stable or improved- update b12 today. Continue current meds for now    # Erectile dysfunction-sildenafil as needed still helpful   # Asthma-sparing albuterol- not needing lately   #elevated blood pressure reading S: medication: none Home readings #s: no cuff BP Readings from Last 3 Encounters:  02/06/23 (!) 148/88  02/04/22 118/70  02/01/21 (!) 157/70  A/P: 2 of last 3 blood pressure checks elevated >140/90 and goal is under this.  I would like for you to buy/use a home cuff (omron series 3 or silver series is usually reasonably priced)  to check at least 4x a week. Your goal is <140/90.  Update me in 2 weeks by mychart.   -if elevated we may need medicine -dash eating plan can also diet  Recommended follow up: Return in about 1 year (around 02/06/2024) for physical or sooner if needed.Schedule b4 you leave. Future Appointments  Date Time Provider Department Center  11/05/2023  8:15 AM [facility] ANNUAL WELLNESS VISIT 1 [facility]   Lab/Order associations: fasting   ICD-10-CM   1. Preventative health care  Z00.00     2. Hyperlipidemia, unspecified hyperlipidemia type  E78.5     3. B12 deficiency  E53.8     4. Hyperglycemia  R73.9     5. Screening for diabetes mellitus  Z13.1       No orders of the defined types were placed in this encounter.   Return precautions advised.  Tana Conch, MD

## 2023-02-06 NOTE — Patient Instructions (Addendum)
2 of last 3 blood pressure checks elevated >140/90 and goal is under this.  BP Readings from Last 3 Encounters:  02/06/23 (!) 148/88  02/04/22 118/70  02/01/21 (!) 157/70   I would like for you to buy/use a home cuff (omron series 3 or silver series is usually reasonably priced)  to check at least 4x a week. Your goal is <140/90.  Update me in 2 weeks by mychart.  -if elevated we may need medicine -dash eating plan can also diet  Please stop by lab before you go If you have mychart- we will send your results within 3 business days of [place] receiving them.  If you do not have mychart- we will call you about results within 5 business days of [place] receiving them.  *please also note that you will see labs on mychart as soon as they post. I will later go in and write notes on them- will say "notes from Dr. Durene Cal"   Recommended follow up: Return in about 1 year (around 02/06/2024) for physical or sooner if needed.Schedule b4 you leave.

## 2023-03-14 ENCOUNTER — Other Ambulatory Visit: Payer: Self-pay | Admitting: Family Medicine

## 2023-03-16 ENCOUNTER — Other Ambulatory Visit (HOSPITAL_BASED_OUTPATIENT_CLINIC_OR_DEPARTMENT_OTHER): Payer: Self-pay

## 2023-03-17 ENCOUNTER — Other Ambulatory Visit: Payer: Self-pay

## 2023-03-17 ENCOUNTER — Other Ambulatory Visit (HOSPITAL_BASED_OUTPATIENT_CLINIC_OR_DEPARTMENT_OTHER): Payer: Self-pay

## 2023-03-17 MED ORDER — GABAPENTIN 300 MG PO CAPS
900.0000 mg | ORAL_CAPSULE | Freq: Two times a day (BID) | ORAL | 3 refills | Status: DC
  Filled 2023-03-17 (×2): qty 540, 90d supply, fill #0
  Filled 2023-06-13 (×2): qty 540, 90d supply, fill #1
  Filled 2023-09-08: qty 540, 90d supply, fill #2
  Filled 2023-12-08: qty 540, 90d supply, fill #3

## 2023-04-18 ENCOUNTER — Other Ambulatory Visit: Payer: Self-pay | Admitting: Family Medicine

## 2023-04-20 ENCOUNTER — Other Ambulatory Visit (HOSPITAL_BASED_OUTPATIENT_CLINIC_OR_DEPARTMENT_OTHER): Payer: Self-pay

## 2023-04-20 MED ORDER — DULOXETINE HCL 60 MG PO CPEP
60.0000 mg | ORAL_CAPSULE | Freq: Every day | ORAL | 3 refills | Status: DC
  Filled 2023-04-20: qty 90, 90d supply, fill #0
  Filled 2023-07-19: qty 90, 90d supply, fill #1
  Filled 2023-10-18: qty 90, 90d supply, fill #2
  Filled 2024-01-10: qty 90, 90d supply, fill #3

## 2023-04-24 ENCOUNTER — Other Ambulatory Visit (HOSPITAL_BASED_OUTPATIENT_CLINIC_OR_DEPARTMENT_OTHER): Payer: Self-pay

## 2023-05-29 ENCOUNTER — Other Ambulatory Visit (HOSPITAL_BASED_OUTPATIENT_CLINIC_OR_DEPARTMENT_OTHER): Payer: Self-pay

## 2023-05-29 ENCOUNTER — Other Ambulatory Visit: Payer: Self-pay | Admitting: Family Medicine

## 2023-06-01 ENCOUNTER — Other Ambulatory Visit (HOSPITAL_BASED_OUTPATIENT_CLINIC_OR_DEPARTMENT_OTHER): Payer: Self-pay

## 2023-06-01 MED ORDER — ALBUTEROL SULFATE HFA 108 (90 BASE) MCG/ACT IN AERS
2.0000 | INHALATION_SPRAY | Freq: Four times a day (QID) | RESPIRATORY_TRACT | 2 refills | Status: AC | PRN
  Filled 2023-06-01: qty 6.7, 28d supply, fill #0

## 2023-06-13 ENCOUNTER — Other Ambulatory Visit (HOSPITAL_BASED_OUTPATIENT_CLINIC_OR_DEPARTMENT_OTHER): Payer: Self-pay

## 2023-08-05 ENCOUNTER — Other Ambulatory Visit: Payer: Self-pay | Admitting: Family Medicine

## 2023-08-06 ENCOUNTER — Other Ambulatory Visit (HOSPITAL_BASED_OUTPATIENT_CLINIC_OR_DEPARTMENT_OTHER): Payer: Self-pay

## 2023-08-06 MED ORDER — SILDENAFIL CITRATE 20 MG PO TABS
40.0000 mg | ORAL_TABLET | ORAL | 3 refills | Status: AC | PRN
  Filled 2023-08-06 (×2): qty 25, 10d supply, fill #0
  Filled 2023-12-08: qty 50, 20d supply, fill #1
  Filled 2024-01-30: qty 50, 20d supply, fill #2

## 2023-11-05 ENCOUNTER — Ambulatory Visit: Payer: PPO

## 2023-11-05 VITALS — Ht 72.0 in | Wt 225.0 lb

## 2023-11-05 DIAGNOSIS — Z0000 Encounter for general adult medical examination without abnormal findings: Secondary | ICD-10-CM | POA: Diagnosis not present

## 2023-11-05 NOTE — Progress Notes (Signed)
 Subjective:   Charles Ricciardi Sr. is a 73 y.o. who presents for a Medicare Wellness preventive visit.  As a reminder, Annual Wellness Visits don't include a physical exam, and some assessments may be limited, especially if this visit is performed virtually. We may recommend an in-person follow-up visit with your provider if needed.  Visit Complete: Virtual I connected with  Charles Morella Sr. on 11/05/23 by a audio enabled telemedicine application and verified that I am speaking with the correct person using two identifiers.  Patient Location: Home  Provider Location: Home Office  I discussed the limitations of evaluation and management by telemedicine. The patient expressed understanding and agreed to proceed.  Vital Signs: Because this visit was a virtual/telehealth visit, some criteria may be missing or patient reported. Any vitals not documented were not able to be obtained and vitals that have been documented are patient reported.  VideoDeclined- This patient declined Librarian, academic. Therefore the visit was completed with audio only.  Persons Participating in Visit: Patient.  AWV Questionnaire: No: Patient Medicare AWV questionnaire was not completed prior to this visit.  Cardiac Risk Factors include: advanced age (>80men, >55 women);dyslipidemia;obesity (BMI >30kg/m2);male gender     Objective:     Today's Vitals   11/05/23 0856 11/05/23 0858  Weight: 225 lb (102.1 kg)   Height: 6' (1.829 m)   PainSc:  8    Body mass index is 30.52 kg/m.     11/05/2023    9:08 AM 10/30/2022    8:34 AM 10/22/2021    8:38 AM 08/06/2020    3:25 PM 11/25/2019    5:42 PM 07/26/2019    2:35 PM 01/29/2018    4:17 PM  Advanced Directives  Does Patient Have a Medical Advance Directive? No Yes Yes Yes No Yes Yes  Type of Special educational needs teacher of [place];Living will Healthcare Power of [employer] Power of Attorney  Living  will;Healthcare Power of Attorney Living will;Healthcare Power of Attorney  Does patient want to make changes to medical advance directive?      No - Patient declined No - Patient declined  Copy of Healthcare Power of Attorney in Chart?  No - copy requested No - copy requested No - copy requested  No - copy requested No - copy requested  Would patient like information on creating a medical advance directive? No - Patient declined          Current Medications (verified) Outpatient Encounter Medications as of 11/05/2023  Medication Sig   albuterol  (VENTOLIN  HFA) 108 (90 Base) MCG/ACT inhaler Inhale 2 puffs into the lungs every 6 (six) hours as needed for wheezing or shortness of breath.   DULoxetine  (CYMBALTA ) 60 MG capsule Take 1 capsule (60 mg total) by mouth daily.   gabapentin  (NEURONTIN ) 300 MG capsule Take 3 capsules (900 mg total) by mouth 2 (two) times daily.   sildenafil  (REVATIO ) 20 MG tablet Take 2-5 tablets (40-100 mg total) by mouth every other day as needed for erectile dysfunction.   No facility-administered encounter medications on file as of 11/05/2023.    Allergies (verified) Atorvastatin  and Codeine   History: Past Medical History:  Diagnosis Date   Asthma    albuterol  once every other week, likely cat allergy   Chronic back pain    mild after back surgery L4 and L5 ruptured disc with surgery 2013   GERD (gastroesophageal reflux disease)    omeprazole prn   Hyperlipidemia  no rx   Neuropathy    gabapentin  300mg  BID   Past Surgical History:  Procedure Laterality Date   CARPAL TUNNEL RELEASE Right    cataract surgery Bilateral    Colonscopy     LUMBAR LAMINECTOMY/DECOMPRESSION MICRODISCECTOMY  08/27/2011   Procedure: LUMBAR LAMINECTOMY/DECOMPRESSION MICRODISCECTOMY 2 LEVELS;  Surgeon: Adelbert Adler, MD;  Location: [facility];  Service: Neurosurgery;  Laterality: Left;  Left Lumbar four- five, five-sacral one Diskectomy   Family History  Problem Relation  Age of Onset   Hypercholesterolemia Mother        father, sisters, brothers   Stroke Mother        late 23s, early 90s   Diabetes Mother    Congestive Heart Failure Mother    Heart disease Sister        CABG sister 18- heavy smoker    Hypercholesterolemia Sister    Hypercholesterolemia Father    Hypercholesterolemia Brother    Hypercholesterolemia Sister    Hypercholesterolemia Sister    Hypercholesterolemia Sister    Early death Sister    Hydrocephalus Sister    Hypercholesterolemia Sister    Hypercholesterolemia Brother    Pancreatic cancer Brother    Cancer Brother    Hypercholesterolemia Brother    Hypercholesterolemia Brother    Anesthesia problems Neg Hx    Social History   Socioeconomic History   Marital status: Married    Spouse name: Not on file   Number of children: Not on file   Years of education: Not on file   Highest education level: Not on file  Occupational History   Not on file  Tobacco Use   Smoking status: Never   Smokeless tobacco: Never  Vaping Use   Vaping status: Never Used  Substance and Sexual Activity   Alcohol use: Yes    Alcohol/week: 0.0 standard drinks of alcohol    Comment: rarely 2 beers a year   Drug use: No   Sexual activity: Not on file  Other Topics Concern   Not on file  Social History Narrative   Family: Married 22 years in 2022 (2nd marriage). 4 biological kids, 2 step children- 10 grandkids, 4 step grandkids. 1 dog- pit bull and lab mix.    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      Hobbies: rest, deer and rabbit hunt   Social Drivers of Corporate investment banker Strain: Low Risk  (11/05/2023)   Overall Financial Resource Strain (CARDIA)    Difficulty of Paying Living Expenses: Not hard at all  Food Insecurity: No Food Insecurity (11/05/2023)   Hunger Vital Sign    Worried About Running Out of Food in the Last Year: Never true     Ran Out of Food in the Last Year: Never true  Transportation Needs: No Transportation Needs (11/05/2023)   PRAPARE - Administrator, Civil Service (Medical): No    Lack of Transportation (Non-Medical): No  Physical Activity: Inactive (11/05/2023)   Exercise Vital Sign    Days of Exercise per Week: 0 days    Minutes of Exercise per Session: 0 min  Stress: No Stress Concern Present (11/05/2023)   [employer] of Occupational Health - Occupational Stress Questionnaire    Feeling of Stress : Not at all  Social Connections: Moderately Isolated (11/05/2023)   Social Connection and Isolation  Panel [NHANES]    Frequency of Communication with Friends and Family: More than three times a week    Frequency of Social Gatherings with Friends and Family: More than three times a week    Attends Religious Services: Never    Database administrator or Organizations: No    Attends Engineer, structural: Never    Marital Status: Married    Tobacco Counseling Counseling given: Yes    Clinical Intake:  Pre-visit preparation completed: Yes  Pain : 0-10 Pain Score: 8  Pain Type: Chronic pain Pain Location: Hand Pain Orientation: Right, Left Pain Descriptors / Indicators: Constant Pain Onset: More than a month ago Pain Frequency: Constant  Pain Location: Foot (site 3 both knees)  BMI - recorded: 30.52 Nutritional Risks: None Diabetes: No  Lab Results  Component Value Date   HGBA1C 6.1 02/06/2023   HGBA1C 6.0 02/04/2022   HGBA1C 5.9 02/01/2021     How often do you need to have someone help you when you read instructions, pamphlets, or other written materials from your doctor or pharmacy?: 1 - Never  Interpreter Needed?: No  Information entered by :: Sally Crazier CMA   Activities of Daily Living     11/05/2023    9:01 AM  In your present state of health, do you have any difficulty performing the following activities:  Hearing? 0  Vision? 0  Difficulty concentrating  or making decisions? 0  Walking or climbing stairs? 1  Dressing or bathing? 0  Doing errands, shopping? 0  Preparing Food and eating ? N  Using the Toilet? N  In the past six months, have you accidently leaked urine? N  Do you have problems with loss of bowel control? N  Managing your Medications? N  Managing your Finances? N  Housekeeping or managing your Housekeeping? N    Patient Care Team: Almira Jaeger, MD as PCP - General (Family Medicine) Drusilla Gerlach, MD as Consulting Physician (Dermatology)  I have updated your Care Teams any recent Medical Services you may have received from other providers in the past year.     Assessment:    This is a routine wellness examination for Charles Harrington.  Hearing/Vision screen Hearing Screening - Comments:: Patient denies any hearing difficulties.   Vision Screening - Comments:: Wears reading glasses - up to date with routine eye exams with  [facility]   Goals Addressed             This Visit's Progress    Patient Stated       Remain active and healthy and be able to continue working       Depression Screen     11/05/2023    9:03 AM 02/06/2023    9:46 AM 10/30/2022    8:33 AM 10/22/2021    8:37 AM 02/01/2021   10:11 AM 08/06/2020    3:23 PM 07/26/2019    2:36 PM  PHQ 2/9 Scores  PHQ - 2 Score 0 0 0 0 0 0 0  PHQ- 9 Score 0 0         Fall Risk     11/05/2023    9:12 AM 02/06/2023    9:46 AM 10/30/2022    8:35 AM 10/22/2021    8:39 AM 02/01/2021   10:11 AM  Fall Risk   Falls in the past year? 0 0 0 0 0  Number falls in past yr: 0 0 0 0   Injury with Fall? 0  0 0 0   Risk for fall due to : No Fall Risks No Fall Risks Impaired vision Impaired vision No Fall Risks  Follow up Falls evaluation completed Falls evaluation completed Falls prevention discussed Falls prevention discussed     MEDICARE RISK AT HOME:  Medicare Risk at Home Any stairs in or around the home?: Yes If so, are there any without handrails?: No Home  free of loose throw rugs in walkways, pet beds, electrical cords, etc?: Yes Adequate lighting in your home to reduce risk of falls?: Yes Life alert?: No Use of a cane, walker or w/c?: No Grab bars in the bathroom?: Yes Shower chair or bench in shower?: Yes Elevated toilet seat or a handicapped toilet?: Yes  TIMED UP AND GO:  Was the test performed?  No  Cognitive Function: 6CIT completed        11/05/2023    9:14 AM 10/30/2022    8:38 AM 10/22/2021    8:41 AM 08/06/2020    3:28 PM 07/26/2019    2:36 PM  6CIT Screen  What Year? 0 points 0 points 0 points 0 points 0 points  What month? 0 points 0 points 0 points 0 points 0 points  What time? 0 points 0 points 0 points  0 points  Count back from 20 0 points 0 points 0 points 0 points 0 points  Months in reverse 0 points 0 points 2 points 4 points 0 points  Repeat phrase 0 points 0 points 0 points 4 points 0 points  Total Score 0 points 0 points 2 points  0 points    Immunizations Immunization History  Administered Date(s) Administered   Influenza,inj,Quad PF,6+ Mos 02/14/2015, 04/11/2016   Influenza-Unspecified 05/04/2017, 04/06/2018, 03/17/2019   PFIZER(Purple Top)SARS-COV-2 Vaccination 01/27/2020, 02/18/2020   Pneumococcal Conjugate-13 10/22/2017   Pneumococcal Polysaccharide-23 03/28/2019   Tdap 10/15/2015    Screening Tests Health Maintenance  Topic Date Due   Zoster Vaccines- Shingrix (1 of 2) Never done   COVID-19 Vaccine (3 - 2024-25 season) 02/01/2023   INFLUENZA VACCINE  01/01/2024   Fecal DNA (Cologuard)  02/09/2024   Medicare Annual Wellness (AWV)  11/04/2024   DTaP/Tdap/Td (2 - Td or Tdap) 10/14/2025   Pneumonia Vaccine 43+ Years old  Completed   Hepatitis C Screening  Completed   HPV VACCINES  Aged Out   Meningococcal B Vaccine  Aged Out    Health Maintenance  Health Maintenance Due  Topic Date Due   Zoster Vaccines- Shingrix (1 of 2) Never done   COVID-19 Vaccine (3 - 2024-25 season) 02/01/2023    Health Maintenance Items Addressed: Patient advised of recommended vaccines and where to obtain those vaccines with verbal understanding  Additional Screening:  Vision Screening: Recommended annual ophthalmology exams for early detection of glaucoma and other disorders of the eye. Would you like a referral to an eye doctor? No    Dental Screening: Recommended annual dental exams for proper oral hygiene  Community Resource Referral / Chronic Care Management: CRR required this visit?  No   CCM required this visit?  No   Plan:    I have personally reviewed and noted the following in the patient's chart:   Medical and social history Use of alcohol, tobacco or illicit drugs  Current medications and supplements including opioid prescriptions. Patient is not currently taking opioid prescriptions. Functional ability and status Nutritional status Physical activity Advanced directives List of other physicians Hospitalizations, surgeries, and ER visits in previous 12 months Vitals Screenings  to include cognitive, depression, and falls Referrals and appointments  In addition, I have reviewed and discussed with patient certain preventive protocols, quality metrics, and best practice recommendations. A written personalized care plan for preventive services as well as general preventive health recommendations were provided to patient.   Uchechukwu Dhawan, CMA   11/05/2023   After Visit Summary: (MyChart) Due to this being a telephonic visit, the after visit summary with patients personalized plan was offered to patient via MyChart   Notes: Please refer to Routing Comments.

## 2023-11-05 NOTE — Patient Instructions (Signed)
 [name] , Thank you for taking time out of your busy schedule to complete your Annual Wellness Visit with me. I enjoyed our conversation and look forward to speaking with you again next year. I, as well as your care team,  appreciate your ongoing commitment to your health goals. Please review the following plan we discussed and let me know if I can assist you in the future. Your Game plan/ To Do List    Referrals: If you haven't heard from the office you've been referred to, please reach out to them at the phone number provided.  I will send a message to Charles Harrington letting him know that you would like a referral to [facility] in [place] Follow up Visits: Next Medicare AWV with our clinical staff:   November 07, 2024 at 8:00 am telephone visit Have you seen your provider in the last 6 months (3 months if uncontrolled diabetes)? No Next Office Visit with your provider:   November 09, 2023 at 4:00 pm   Clinician Recommendations:   Aim for 30 minutes of exercise or brisk walking, 6-8 glasses of water, and 5 servings of fruits and vegetables each day.  I enjoyed our conversation today and look forward to talking with you again next year!! Have a wonderful and safe year. All the best, Charles Harrington This is a list of the screening recommended for you and due dates:  Health Maintenance  Topic Date Due   Zoster (Shingles) Vaccine (1 of 2) Never done   COVID-19 Vaccine (3 - 2024-25 season) 02/01/2023   Flu Shot  01/01/2024   Cologuard (Stool DNA test)  02/09/2024   Medicare Annual Wellness Visit  11/04/2024   DTaP/Tdap/Td vaccine (2 - Td or Tdap) 10/14/2025   Pneumonia Vaccine  Completed   Hepatitis C Screening  Completed   HPV Vaccine  Aged Out   Meningitis B Vaccine  Aged Out    Advanced directives: (Declined) Advance directive discussed with you today. Even though you declined this today, please call our office should you change your mind, and we can give you the proper  paperwork for you to fill out. Advance Care Planning is important because it:  [x]  Makes sure you receive the medical care that is consistent with your values, goals, and preferences  [x]  It provides guidance to your family and loved ones and reduces their decisional burden about whether or not they are making the right decisions based on your wishes.  Follow the link provided in your after visit summary or read over the paperwork we have mailed to you to help you started getting your Advance Directives in place. If you need assistance in completing these, please reach out to us  so that we can help you! See attachments for Preventive Care and Fall Prevention Tips.  Understanding Your Risk for Falls Millions of people have serious injuries from falls each year. It is important to understand your risk of falling. Talk with your health care provider about your risk and what you can do to lower it. If you do have a serious fall, make sure to tell your provider. Falling once raises your risk of falling again. How can falls affect me? Serious injuries from falls are common. These include: Broken bones, such as hip fractures. Head injuries, such as traumatic brain injuries (TBI) or concussions. A fear of falling can cause you to avoid activities and stay at home. This can make your muscles weaker and raise your risk for  a fall. What can increase my risk? There are a number of risk factors that increase your risk for falling. The more risk factors you have, the higher your risk of falling. Serious injuries from a fall happen most often to people who are older than 73 years old. Teenagers and young adults ages 31-29 are also at higher risk. Common risk factors include: Weakness in the lower body. Being generally weak or confused due to long-term (chronic) illness. Dizziness or balance problems. Poor vision. Medicines that cause dizziness or drowsiness. These may include: Medicines for your blood  pressure, heart, anxiety, insomnia, or swelling (edema). Pain medicines. Muscle relaxants. Other risk factors include: Drinking alcohol. Having had a fall in the past. Having foot pain or wearing improper footwear. Working at a dangerous job. Having any of the following in your home: Tripping hazards, such as floor clutter or loose rugs. Poor lighting. Pets. Having dementia or memory loss. What actions can I take to lower my risk of falling?     Physical activity Stay physically fit. Do strength and balance exercises. Consider taking a regular class to build strength and balance. Yoga and tai chi are good options. Vision Have your eyes checked every year and your prescription for glasses or contacts updated as needed. Shoes and walking aids Wear non-skid shoes. Wear shoes that have rubber soles and low heels. Do not wear high heels. Do not walk around the house in socks or slippers. Use a cane or walker as told by your provider. Home safety Attach secure railings on both sides of your stairs. Install grab bars for your bathtub, shower, and toilet. Use a non-skid mat in your bathtub or shower. Attach bath mats securely with double-sided, non-slip rug tape. Use good lighting in all rooms. Keep a flashlight near your bed. Make sure there is a clear path from your bed to the bathroom. Use night-lights. Do not use throw rugs. Make sure all carpeting is taped or tacked down securely. Remove all clutter from walkways and stairways, including extension cords. Repair uneven or broken steps and floors. Avoid walking on icy or slippery surfaces. Walk on the grass instead of on icy or slick sidewalks. Use ice melter to get rid of ice on walkways in the winter. Use a cordless phone. Questions to ask your health care provider Can you help me check my risk for a fall? Do any of my medicines make me more likely to fall? Should I take a vitamin D supplement? What exercises can I do to improve  my strength and balance? Should I make an appointment to have my vision checked? Do I need a bone density test to check for weak bones (osteoporosis)? Would it help to use a cane or a walker? Where to find more information Centers for Disease Control and Prevention, STEADI: TonerPromos.no Community-Based Fall Prevention Programs: TonerPromos.no [employer] on Aging: BaseRingTones.pl Contact a health care provider if: You fall at home. You are afraid of falling at home. You feel weak, drowsy, or dizzy. This information is not intended to replace advice given to you by your health care provider. Make sure you discuss any questions you have with your health care provider. Document Revised: 01/20/2022 Document Reviewed: 01/20/2022 Elsevier Patient Education  2024 [employer].  Understanding Your Risk for Falls Millions of people have serious injuries from falls each year. It is important to understand your risk of falling. Talk with your health care provider about your risk and what you can do to  lower it. If you do have a serious fall, make sure to tell your provider. Falling once raises your risk of falling again. How can falls affect me? Serious injuries from falls are common. These include: Broken bones, such as hip fractures. Head injuries, such as traumatic brain injuries (TBI) or concussions. A fear of falling can cause you to avoid activities and stay at home. This can make your muscles weaker and raise your risk for a fall. What can increase my risk? There are a number of risk factors that increase your risk for falling. The more risk factors you have, the higher your risk of falling. Serious injuries from a fall happen most often to people who are older than 73 years old. Teenagers and young adults ages 57-29 are also at higher risk. Common risk factors include: Weakness in the lower body. Being generally weak or confused due to long-term (chronic) illness. Dizziness or balance  problems. Poor vision. Medicines that cause dizziness or drowsiness. These may include: Medicines for your blood pressure, heart, anxiety, insomnia, or swelling (edema). Pain medicines. Muscle relaxants. Other risk factors include: Drinking alcohol. Having had a fall in the past. Having foot pain or wearing improper footwear. Working at a dangerous job. Having any of the following in your home: Tripping hazards, such as floor clutter or loose rugs. Poor lighting. Pets. Having dementia or memory loss. What actions can I take to lower my risk of falling?     Physical activity Stay physically fit. Do strength and balance exercises. Consider taking a regular class to build strength and balance. Yoga and tai chi are good options. Vision Have your eyes checked every year and your prescription for glasses or contacts updated as needed. Shoes and walking aids Wear non-skid shoes. Wear shoes that have rubber soles and low heels. Do not wear high heels. Do not walk around the house in socks or slippers. Use a cane or walker as told by your provider. Home safety Attach secure railings on both sides of your stairs. Install grab bars for your bathtub, shower, and toilet. Use a non-skid mat in your bathtub or shower. Attach bath mats securely with double-sided, non-slip rug tape. Use good lighting in all rooms. Keep a flashlight near your bed. Make sure there is a clear path from your bed to the bathroom. Use night-lights. Do not use throw rugs. Make sure all carpeting is taped or tacked down securely. Remove all clutter from walkways and stairways, including extension cords. Repair uneven or broken steps and floors. Avoid walking on icy or slippery surfaces. Walk on the grass instead of on icy or slick sidewalks. Use ice melter to get rid of ice on walkways in the winter. Use a cordless phone. Questions to ask your health care provider Can you help me check my risk for a fall? Do any of  my medicines make me more likely to fall? Should I take a vitamin D supplement? What exercises can I do to improve my strength and balance? Should I make an appointment to have my vision checked? Do I need a bone density test to check for weak bones (osteoporosis)? Would it help to use a cane or a walker? Where to find more information Centers for Disease Control and Prevention, STEADI: TonerPromos.no Community-Based Fall Prevention Programs: TonerPromos.no [employer] on Aging: BaseRingTones.pl Contact a health care provider if: You fall at home. You are afraid of falling at home. You feel weak, drowsy, or dizzy. This information is not intended  to replace advice given to you by your health care provider. Make sure you discuss any questions you have with your health care provider. Document Revised: 01/20/2022 Document Reviewed: 01/20/2022 Elsevier Patient Education  2024 [employer].

## 2023-11-09 ENCOUNTER — Encounter: Payer: Self-pay | Admitting: Family Medicine

## 2023-11-09 ENCOUNTER — Ambulatory Visit (INDEPENDENT_AMBULATORY_CARE_PROVIDER_SITE_OTHER): Admitting: Family Medicine

## 2023-11-09 VITALS — BP 110/70 | HR 70 | Temp 97.1°F | Ht 72.0 in | Wt 226.8 lb

## 2023-11-09 DIAGNOSIS — E785 Hyperlipidemia, unspecified: Secondary | ICD-10-CM

## 2023-11-09 DIAGNOSIS — Z131 Encounter for screening for diabetes mellitus: Secondary | ICD-10-CM | POA: Diagnosis not present

## 2023-11-09 DIAGNOSIS — M48061 Spinal stenosis, lumbar region without neurogenic claudication: Secondary | ICD-10-CM

## 2023-11-09 DIAGNOSIS — G720 Drug-induced myopathy: Secondary | ICD-10-CM

## 2023-11-09 DIAGNOSIS — R739 Hyperglycemia, unspecified: Secondary | ICD-10-CM | POA: Diagnosis not present

## 2023-11-09 DIAGNOSIS — G609 Hereditary and idiopathic neuropathy, unspecified: Secondary | ICD-10-CM

## 2023-11-09 DIAGNOSIS — E538 Deficiency of other specified B group vitamins: Secondary | ICD-10-CM | POA: Diagnosis not present

## 2023-11-09 NOTE — Patient Instructions (Addendum)
 Please stop by lab before you go If you have mychart- we will send your results within 3 business days of us  receiving them.  If you do not have mychart- we will call you about results within 5 business days of us  receiving them.  *please also note that you will see labs on mychart as soon as they post. I will later go in and write notes on them- will say "notes from Dr. Arlene Ben"   We have placed a referral for you today to [facility] neurology- please call their # if you do not hear within a week (may be listed below or you may see mychart message within a few days with #).    Recommended follow up: Return in about 6 months (around 05/10/2024) for physical or sooner if needed.Schedule b4 you leave.

## 2023-11-09 NOTE — Progress Notes (Signed)
 Phone [phone] In person visit   Subjective:   Charles Harrington. is a 73 y.o. year old very pleasant male patient who presents for/with See problem oriented charting Chief Complaint  Patient presents with   Medical Management of Chronic Issues   Hyperlipidemia    Past Medical History-  Patient Active Problem List   Diagnosis Date Noted   B12 deficiency 03/28/2019    Priority: Medium    Hereditary and idiopathic peripheral neuropathy 02/14/2015    Priority: Medium    Hyperglycemia 02/14/2015    Priority: Medium    Asthma     Priority: Medium    Hyperlipidemia     Priority: Medium    Erectile dysfunction 08/18/2018    Priority: Low   Carpal tunnel syndrome 02/14/2015    Priority: Low   Hearing loss 02/14/2015    Priority: Low   GERD (gastroesophageal reflux disease)     Priority: Low   Chronic back pain     Priority: Low   Lichen planus 10/23/2016    Medications- reviewed and updated Current Outpatient Medications  Medication Sig Dispense Refill   albuterol  (VENTOLIN  HFA) 108 (90 Base) MCG/ACT inhaler Inhale 2 puffs into the lungs every 6 (six) hours as needed for wheezing or shortness of breath. 6.7 g 2   DULoxetine  (CYMBALTA ) 60 MG capsule Take 1 capsule (60 mg total) by mouth daily. 90 capsule 3   gabapentin  (NEURONTIN ) 300 MG capsule Take 3 capsules (900 mg total) by mouth 2 (two) times daily. 540 capsule 3   sildenafil  (REVATIO ) 20 MG tablet Take 2-5 tablets (40-100 mg total) by mouth every other day as needed for erectile dysfunction. 50 tablet 3   No current facility-administered medications for this visit.     Objective:  BP 110/70   Pulse 70   Temp (!) 97.1 F (36.2 C)   Ht 6' (1.829 m)   Wt 226 lb 12.8 oz (102.9 kg)   SpO2 96%   BMI 30.76 kg/m  Gen: NAD, resting comfortably CV: RRR no murmurs rubs or gallops Lungs: CTAB no crackles, wheeze, rhonchi Ext: no edema Skin: warm, dry Neuro: Intact sensation to bilateral hands to gross  touch-reports some tingling while this is done    Assessment and Plan   # Neuropathy S:Medication: Cymbalta  60 mg daily, gabapentin  900 mg twice daily -reports saw neurology over 10 years ago -Patient reports ongoing pain and there was mention of possible referral to neurosurgery at his wellness visit.  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 Was worse in feet at first- tried good feet store but worsened pain. Feeling in both hands- prior carpal tunnel surgery was not helpful- seems to affect all fingers and up into wrists. Worse in evening- can't lay on left side- lays on back or right side- if lays on right hip bothers him -pain in legs now up to the knees- pain 8/10 -last 2-3 months have been worst -taking B12 regularly Plus takes tylenol  several per day Lab Results  Component Value Date   VITAMINB12 253 02/06/2023   Lab Results  Component Value Date   TSH 1.98 08/18/2018   Lab Results  Component Value Date   HGBA1C 6.1 02/06/2023   HGBA1C 6.0 02/04/2022   HGBA1C 5.9 02/01/2021   A/P: Worsening issues with neuropathy in both hands and feet over the last few months-he does have known lumbar stenosis with prior surgery on the low back this would not account for both issues.  With worsening pain he would like to see neurology again who he saw apparently 10 or 20 years ago-I do not have record of that.  May need updated nerve conduction study - MRN recheck B12 with goal at least hopefully over 400.  Also recheck TSH and A1c to see if there is a contributing factor  #hyperlipidemia with statin myalgia  history on atorvastatin  40 mg even every other week S: Medication:none- doesn't tolerate it due to pain  Lab Results  Component Value Date   CHOL 276 (H) 02/06/2023   HDL 40.60 02/06/2023   LDLCALC 181 (H) 02/06/2023   LDLDIRECT 179.0 02/04/2022   TRIG 268.0 (H) 02/06/2023   CHOLHDL 7 02/06/2023  A/P:  lipids above goal- not tolerating medicine- continue to work on lifestyle given myalgias  # Hyperglycemia/insulin resistance/prediabetes-last A1c 6.1  S:  Medication: none Lab Results  Component Value Date   HGBA1C 6.1 02/06/2023   HGBA1C 6.0 02/04/2022   HGBA1C 5.9 02/01/2021  A/P: Prediabetes noted-update A1c with labs today  # B12 deficiency S: Current treatment/medication (oral vs. IM): Oral B12 1000 mcg daily Lab Results  Component Value Date   VITAMINB12 253 02/06/2023  A/P: If B12 still under 400 consider injections  Recommended follow up: Return in about 6 months (around 05/10/2024) for physical or sooner if needed.Schedule b4 you leave. Future Appointments  Date Time Provider Department Center  11/07/2024  8:00 AM [facility] ANNUAL WELLNESS VISIT 1 [facility]    Lab/Order associations:   ICD-10-CM   1. Hereditary and idiopathic peripheral neuropathy  G60.9 Ambulatory referral to Neurology    2. Spinal stenosis of lumbar region, unspecified whether neurogenic claudication present  M48.061     3. B12 deficiency  E53.8 Vitamin B12    4. Hyperlipidemia, unspecified hyperlipidemia type  E78.5 CBC with Differential/Platelet    Comprehensive metabolic panel with GFR    TSH    5. Hyperglycemia  R73.9 HgB A1c    6. 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   Return precautions advised.  Clarisa Crooked, MD

## 2023-11-10 ENCOUNTER — Encounter: Payer: Self-pay | Admitting: Neurology

## 2023-11-13 ENCOUNTER — Ambulatory Visit: Payer: Self-pay | Admitting: Family Medicine

## 2023-12-09 ENCOUNTER — Ambulatory Visit: Admitting: Neurology

## 2023-12-09 ENCOUNTER — Other Ambulatory Visit: Payer: Self-pay

## 2023-12-09 ENCOUNTER — Other Ambulatory Visit (HOSPITAL_BASED_OUTPATIENT_CLINIC_OR_DEPARTMENT_OTHER): Payer: Self-pay

## 2023-12-09 ENCOUNTER — Encounter: Payer: Self-pay | Admitting: Neurology

## 2023-12-09 ENCOUNTER — Other Ambulatory Visit

## 2023-12-09 VITALS — BP 146/78 | HR 74 | Ht 72.0 in | Wt 223.0 lb

## 2023-12-09 DIAGNOSIS — R202 Paresthesia of skin: Secondary | ICD-10-CM | POA: Diagnosis not present

## 2023-12-09 DIAGNOSIS — G629 Polyneuropathy, unspecified: Secondary | ICD-10-CM | POA: Diagnosis not present

## 2023-12-09 MED ORDER — GABAPENTIN 300 MG PO CAPS
900.0000 mg | ORAL_CAPSULE | Freq: Three times a day (TID) | ORAL | 1 refills | Status: DC
  Filled 2023-12-09: qty 810, 90d supply, fill #0
  Filled 2024-03-16: qty 810, 90d supply, fill #1

## 2023-12-09 NOTE — Patient Instructions (Signed)
 Check labs  Increase gabapentin  to 900mg  three times daily.  Start by adding 1 tablet at noon x 1 week, then 2 tablets x 1 week, then 3 tablets.

## 2023-12-09 NOTE — Progress Notes (Signed)
 [facility] Clinic Note - Initial Visit   Date: 12/09/2023   Charles Gebhard Sr. MRN: [id number] DOB: 1951-03-07   Dear Dr. Katrinka:  Thank you for your kind referral of Charles Lamartina Sr. for consultation of painful neuropathy. Although his history is well known to you, please allow us  to reiterate it for the purpose of our medical record. The patient was accompanied to the clinic by wife who also provides collateral information.     Charles Dimichele Sr. is a 74 y.o. left-handed male with GERD, hyperlipidemia, s/p lumbar surgery and s/p right CTS release presenting for evaluation of painful neuropathy.   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  His is mostly bothered by pain in the feet, which I think is also contributed by arthritic changes since he also reports achy/throbbing pain and has some relief with tylenol /NSAIDs.  For his neuropathic pain, I recommend increasing gabapentin  to 900mg  TID (renal function intact) and continue cymbalta  60mg  daily.  I offered NCS/EMG to further evaluate for overlapping radiculopathy/entrapment neuropathy (left CTS suspected), which he declined.  He is agreeable to checking a few labs to look for other causes of neuropathy, however, most of the time, these return normal and management remains symptomatic.  Check folate, copper, SPEP with IFE.  Going forward, consider nortriptyline or pregabalin. Patient educated on daily foot inspection, fall prevention, and safety precautions around the home.  Return to clinic in 4 months  ------------------------------------------------------------- History of present illness: Starting around late 1990s, he began having bilateral feet pain and tried insoles initially, but over the years his symptoms progressed.  He has numbness/tingling which started in the feet and gradually extended into  the lower legs, up to the knees.  About 5 years ago, he developed numbness/tingling involving the wrist down into the fingers, worse on the left.  He denies radicular arm or leg pain.  He takes gabapentin  900mg  twice daily and duloxetine  60mg  daily, which eases the pain slightly, but does not make a huge difference.  His pain has steadily been getting worse over the past few years.  He also has achy, throbbing pain and takes tylenol  and NSAIDs at bedtime which helps.  Nonsmoker and does not drink alcohol.  No history of cancer or chemotherapy.    Out-side paper records, electronic medical record, and images have been reviewed where available and summarized as:  MRI lumbar spine wo contrast 07/11/2011: Mild congenital spinal stenosis.  Mild stenosis is present at L2-3 and L3-4.  Moderate spinal stenosis at L4-5 with a central disc protrusion. Spondylosis and facet hypertrophy contributes to stenosis at this level.  Central disc protrusion L5 S1 with possible impingement of the S1 nerve root bilaterally, left greater than right.    Lab Results  Component Value Date   HGBA1C 6.0 11/09/2023   Lab Results  Component Value Date   VITAMINB12 429 11/09/2023   Lab Results  Component Value Date   TSH 1.04 11/09/2023   No results found for: ESRSEDRATE, POCTSEDRATE  Past Medical History:  Diagnosis Date   Asthma    albuterol  once every other week, likely cat allergy   Chronic back pain    mild after back surgery L4 and L5 ruptured disc with surgery 2013   GERD (gastroesophageal reflux disease)    omeprazole prn   Hyperlipidemia    no rx   Neuropathy    gabapentin  300mg  BID    Past Surgical  History:  Procedure Laterality Date   CARPAL TUNNEL RELEASE Right    cataract surgery Bilateral    Colonscopy     LUMBAR LAMINECTOMY/DECOMPRESSION MICRODISCECTOMY  08/27/2011   Procedure: LUMBAR LAMINECTOMY/DECOMPRESSION MICRODISCECTOMY 2 LEVELS;  Surgeon: Catalina CHRISTELLA Stains, MD;  Location: [facility];  Service: Neurosurgery;  Laterality: Left;  Left Lumbar four- five, five-sacral one Diskectomy     Medications:  Outpatient Encounter Medications as of 12/09/2023  Medication Sig   albuterol  (VENTOLIN  HFA) 108 (90 Base) MCG/ACT inhaler Inhale 2 puffs into the lungs every 6 (six) hours as needed for wheezing or shortness of breath.   DULoxetine  (CYMBALTA ) 60 MG capsule Take 1 capsule (60 mg total) by mouth daily.   gabapentin  (NEURONTIN ) 300 MG capsule Take 3 capsules (900 mg total) by mouth 2 (two) times daily.   sildenafil  (REVATIO ) 20 MG tablet Take 2-5 tablets (40-100 mg total) by mouth every other day as needed for erectile dysfunction.   vitamin B-12 (CYANOCOBALAMIN ) 100 MCG tablet Take 100 mcg by mouth daily.   No facility-administered encounter medications on file as of 12/09/2023.    Allergies:  Allergies  Allergen Reactions   Atorvastatin      Myalgias even on once every other week 40 mg   Codeine Nausea And Vomiting    Family History: Family History  Problem Relation Age of Onset   Hypercholesterolemia Mother        father, sisters, brothers   Stroke Mother        late 34s, early 38s   Diabetes Mother    Congestive Heart Failure Mother    Heart disease Sister        CABG sister 80- heavy smoker    Hypercholesterolemia Sister    Hypercholesterolemia Father    Hypercholesterolemia Brother    Hypercholesterolemia Sister    Hypercholesterolemia Sister    Hypercholesterolemia Sister    Early death Sister    Hydrocephalus Sister    Hypercholesterolemia Sister    Hypercholesterolemia Brother    Pancreatic cancer Brother    Cancer Brother    Hypercholesterolemia Brother    Hypercholesterolemia Brother    Anesthesia problems Neg Hx     Social History: Social History   Tobacco Use   Smoking status: Never   Smokeless tobacco: Never  Vaping Use   Vaping status: Never Used  Substance Use Topics   Alcohol use: Yes    Alcohol/week: 0.0 standard drinks of  alcohol    Comment: rarely 2 beers a year   Drug use: No   Social History   Social History Narrative   Family: Married 22 years in 2022 (2nd marriage). 4 biological kids, 2 step children- 10 grandkids, 4 step grandkids. 1 dog- pit bull and lab mix.    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 Left Handed    Are you currently employed ? No    What is your current occupation?   Do you live at home alone? No with wife    Who lives with you? Wife    What type of home do you live in: 1 story or 2 story? Two story home but stays on first floor  Vital Signs:  BP (!) 146/78   Pulse 74   Ht 6' (1.829 m)   Wt 223 lb (101.2 kg)   SpO2 96%   BMI 30.24 kg/m   Neurological Exam: MENTAL STATUS including orientation to time, place, person, recent and remote memory, attention span and concentration, language, and fund of knowledge is normal.  Speech is not dysarthric.  CRANIAL NERVES: II:  No visual field defects.     III-IV-VI: Pupils equal round and reactive to light.  Normal conjugate, extra-ocular eye movements in all directions of gaze.  No nystagmus.  No ptosis.   V:  Normal facial sensation.    VII:  Normal facial symmetry and movements.   VIII:  Normal hearing and vestibular function.   IX-X:  Normal palatal movement.   XI:  Normal shoulder shrug and head rotation.   XII:  Normal tongue strength and range of motion, no deviation or fasciculation.  MOTOR:  Left ABP atrophy.  No fasciculations or abnormal movements.  No pronator drift.   Upper Extremity:  Right  Left  Deltoid  5/5   5/5   Biceps  5/5   5/5   Triceps  5/5   5/5   Wrist extensors  5/5   5/5   Wrist flexors  5/5   5/5   Finger extensors  5/5   5/5   Finger flexors  5/5   5/5   Dorsal interossei  5/5   5/5   Abductor pollicis  5/5    4/5   Tone (Ashworth scale)  0  0   Lower Extremity:  Right  Left  Hip flexors  5/5   5/5   Knee flexors  5/5   5/5   Knee extensors  5/5   5/5   Dorsiflexors  5/5   5/5   Plantarflexors  5/5   5/5   Toe extensors  5/5   5/5   Toe flexors  5/5   5/5   Tone (Ashworth scale)  0  0   MSRs:                                           Right        Left brachioradialis 2+  2+  biceps 2+  2+  triceps 2+  2+  patellar 1+  1+  ankle jerk 0  0  plantar response down  down   SENSORY:  Trace vibration at the knees, absent below ankles. Gradient loss of pin prick and temperature below knees distally, absent in the feet.  Sensation intact to all modalities in the hands.  Romberg's sign absent.   COORDINATION/GAIT: Normal finger-to- nose-finger.  Intact rapid alternating movements bilaterally.  Able to rise from a chair without using arms.  Gait mildly wide-based and stable. Stressed gait intact. Mild unsteadiness with tandem gait, able to perform.     Thank you for allowing me to participate in patient's care.  If I can answer any additional questions, I would be pleased to do so.    Sincerely,    Cherica Heiden K. Tobie, DO

## 2023-12-14 LAB — IMMUNOFIXATION ELECTROPHORESIS
IgM, Serum: 1435 mg/dL (ref 600–300)
IgM, Serum: 52 mg/dL (ref 50–300)
Immunoglobulin A: 1435 mg/dL (ref 70–320)
Immunoglobulin A: 315 mg/dL (ref 70–320)

## 2023-12-14 LAB — PROTEIN ELECTROPHORESIS, SERUM
Albumin ELP: 4.6 g/dL (ref 3.8–4.8)
Alpha 1: 0.3 g/dL (ref 0.2–0.3)
Alpha 2: 0.8 g/dL (ref 0.5–0.9)
Beta 2: 0.5 g/dL (ref 0.2–0.5)
Beta Globulin: 0.5 g/dL (ref 0.4–0.6)
Gamma Globulin: 1.3 g/dL (ref 0.8–1.7)
Total Protein: 7.9 g/dL (ref 6.1–8.1)

## 2023-12-14 LAB — COPPER, SERUM: Copper: 146 ug/dL (ref 70–175)

## 2023-12-14 LAB — FOLATE: Folate: 11.6 ng/mL

## 2023-12-15 ENCOUNTER — Ambulatory Visit: Payer: Self-pay | Admitting: Neurology

## 2024-01-04 ENCOUNTER — Ambulatory Visit: Admitting: Neurology

## 2024-01-30 ENCOUNTER — Other Ambulatory Visit (HOSPITAL_BASED_OUTPATIENT_CLINIC_OR_DEPARTMENT_OTHER): Payer: Self-pay

## 2024-03-17 ENCOUNTER — Other Ambulatory Visit (HOSPITAL_BASED_OUTPATIENT_CLINIC_OR_DEPARTMENT_OTHER): Payer: Self-pay

## 2024-04-20 ENCOUNTER — Other Ambulatory Visit: Payer: Self-pay | Admitting: Family Medicine

## 2024-04-21 ENCOUNTER — Other Ambulatory Visit (HOSPITAL_BASED_OUTPATIENT_CLINIC_OR_DEPARTMENT_OTHER): Payer: Self-pay

## 2024-04-21 MED ORDER — DULOXETINE HCL 60 MG PO CPEP
60.0000 mg | ORAL_CAPSULE | Freq: Every day | ORAL | 3 refills | Status: AC
  Filled 2024-04-21: qty 90, 90d supply, fill #0
  Filled 2024-06-22: qty 90, 90d supply, fill #1

## 2024-04-26 ENCOUNTER — Ambulatory Visit: Admitting: Neurology

## 2024-06-18 ENCOUNTER — Other Ambulatory Visit: Payer: Self-pay | Admitting: Neurology

## 2024-06-22 ENCOUNTER — Other Ambulatory Visit: Payer: Self-pay | Admitting: Family Medicine

## 2024-06-22 ENCOUNTER — Other Ambulatory Visit (HOSPITAL_BASED_OUTPATIENT_CLINIC_OR_DEPARTMENT_OTHER): Payer: Self-pay

## 2024-06-22 ENCOUNTER — Telehealth: Payer: Self-pay | Admitting: Neurology

## 2024-06-22 ENCOUNTER — Other Ambulatory Visit: Payer: Self-pay | Admitting: Neurology

## 2024-06-22 ENCOUNTER — Telehealth: Payer: Self-pay | Admitting: Family Medicine

## 2024-06-22 ENCOUNTER — Other Ambulatory Visit (HOSPITAL_COMMUNITY): Payer: Self-pay

## 2024-06-22 MED ORDER — GABAPENTIN 300 MG PO CAPS
900.0000 mg | ORAL_CAPSULE | Freq: Three times a day (TID) | ORAL | 0 refills | Status: AC
  Filled 2024-06-22: qty 810, 90d supply, fill #0

## 2024-06-22 NOTE — Telephone Encounter (Signed)
 Refill sent for 90 days.

## 2024-06-22 NOTE — Telephone Encounter (Signed)
 Called patients wife and informed her Gabapentin  was sent to pharmacy.

## 2024-06-22 NOTE — Telephone Encounter (Signed)
 Spoke with patients wife and let her know she will need to contact Dr. Tobie for Tommys gabapentin  refills as the last two times it was refilled by Dr. Tobie. She verbalized understanding and no further questions at this time.

## 2024-06-22 NOTE — Telephone Encounter (Signed)
 Copied from CRM #[id number]. Topic: Clinical - Medication Refill >> Jun 22, 2024 11:06 AM Nessti S wrote: Medication: gabapentin  (NEURONTIN ) 300 MG capsule   Has the patient contacted their pharmacy? Yes (Agent: If no, request that the patient contact the pharmacy for the refill. If patient does not wish to contact the pharmacy document the reason why and proceed with request.) (Agent: If yes, when and what did the pharmacy advise?)  This is the patient's preferred pharmacy:  [facility] [address] [place] [address] Phone: [phone] Fax: [phone]  Is this the correct pharmacy for this prescription? Yes If no, delete pharmacy and type the correct one.   Has the prescription been filled recently? No  Is the patient out of the medication? Yes  Has the patient been seen for an appointment in the last year OR does the patient have an upcoming appointment? Yes  Can we respond through MyChart? Yes  Agent: Please be advised that Rx refills may take up to 3 business days. We ask that you follow-up with your pharmacy.

## 2024-06-22 NOTE — Telephone Encounter (Signed)
 Pt's wife Lyn cld for Refill of Rx Gabepentin completed F/u appt scheduling. [facility]

## 2024-09-19 ENCOUNTER — Ambulatory Visit: Admitting: Neurology

## 2024-11-07 ENCOUNTER — Ambulatory Visit
